# Patient Record
Sex: Male | Born: 1950 | Race: Black or African American | Hispanic: No | Marital: Married | Smoking: Former smoker
Health system: Southern US, Community
[De-identification: ages and names within clinical notes are randomized; demographics above are authoritative.]

## PROBLEM LIST (undated history)

## (undated) DIAGNOSIS — I10 Essential (primary) hypertension: Secondary | ICD-10-CM

## (undated) DIAGNOSIS — C801 Malignant (primary) neoplasm, unspecified: Secondary | ICD-10-CM

## (undated) HISTORY — DX: Essential (primary) hypertension: I10

## (undated) HISTORY — DX: Malignant (primary) neoplasm, unspecified: C80.1

---

## 2001-04-14 HISTORY — PX: FINGER SURGERY: SHX640

## 2003-01-02 ENCOUNTER — Encounter: Admission: RE | Admit: 2003-01-02 | Discharge: 2003-01-02 | Payer: Self-pay | Admitting: Family Medicine

## 2003-01-02 ENCOUNTER — Encounter: Payer: Self-pay | Admitting: Family Medicine

## 2003-03-14 ENCOUNTER — Encounter: Admission: RE | Admit: 2003-03-14 | Discharge: 2003-03-14 | Payer: Self-pay | Admitting: Family Medicine

## 2003-03-15 ENCOUNTER — Ambulatory Visit (HOSPITAL_COMMUNITY): Admission: RE | Admit: 2003-03-15 | Discharge: 2003-03-15 | Payer: Self-pay | Admitting: Orthopedic Surgery

## 2003-03-15 ENCOUNTER — Ambulatory Visit (HOSPITAL_BASED_OUTPATIENT_CLINIC_OR_DEPARTMENT_OTHER): Admission: RE | Admit: 2003-03-15 | Discharge: 2003-03-15 | Payer: Self-pay | Admitting: Orthopedic Surgery

## 2007-11-22 ENCOUNTER — Emergency Department (HOSPITAL_COMMUNITY): Admission: EM | Admit: 2007-11-22 | Discharge: 2007-11-22 | Payer: Self-pay | Admitting: Emergency Medicine

## 2010-08-30 NOTE — Op Note (Signed)
NAME:  Richard Henson, STROSCHEIN                          ACCOUNT NO.:  [id number]   MEDICAL RECORD NO.:  [id number]                   PATIENT TYPE:  AMB   LOCATION:  DSC                                  FACILITY:  [facility]   PHYSICIAN:  Cindee Salt, M.D.                    DATE OF BIRTH:  08-30-1950   DATE OF PROCEDURE:  03/15/2003  DATE OF DISCHARGE:                                 OPERATIVE REPORT   PREOPERATIVE DIAGNOSIS:  Infection of right index finger, questionable  osteomyelitis, distal phalanx.   POSTOPERATIVE DIAGNOSIS:  Infection of right index finger, questionable  osteomyelitis, distal phalanx.   OPERATION:  Incision and drainage and debridement of distal phalanx, right  index finger.   SURGEON:  Cindee Salt, M.D.   ASSISTANT:  Alfredo Bach.   ANESTHESIA:  General.   HISTORY:  The patient is a 60 year old male with a history of pain, swelling  and drainage of his right index finger.  This has been going on for  approximately two weeks.  He has been on Levaquin.   PROCEDURE:  The patient was brought to the operating room where a general  anesthetic was carried out without complication.  He was prepped using  Duraprep in supine position, right arm free.  The area was debrided and an  incision made.  The area of loss of skin was through the lateral nail fold.  The incision was immediately encountered with a granulation tissue.  Cultures were taken for both aerobic and anaerobic cultures.  This was  followed down to the condyle of the distal phalanx; this was curetted.  No  gross purulence was appreciated.  The wound was copiously irrigated with  saline and packed with Iodoform gauze.  Sterile compressive dressing and  splint were applied.  The patient tolerated the procedure well and was taken  to the recovery room for observation in satisfactory condition.   He is discharged home to return to the [facility] on Monday,  on Levaquin and Vicodin.                                Cindee Salt, M.D.    GK/MEDQ  D:  03/15/2003  T:  03/15/2003  Job:  [id number]

## 2012-03-17 ENCOUNTER — Other Ambulatory Visit: Payer: Self-pay | Admitting: Family Medicine

## 2012-03-17 ENCOUNTER — Ambulatory Visit
Admission: RE | Admit: 2012-03-17 | Discharge: 2012-03-17 | Disposition: A | Discharge status: Home / Self Care | Payer: 59 | Source: Ambulatory Visit | Attending: Family Medicine | Admitting: Family Medicine

## 2012-03-17 DIAGNOSIS — M549 Dorsalgia, unspecified: Secondary | ICD-10-CM

## 2012-03-17 IMAGING — CR DG LUMBAR SPINE COMPLETE 4+V
5 series · 5 of 5 positions shown · non-contrast
Comparison: None.

CLINICAL DATA: Back pain with left leg numbness.  No known injury.

LUMBAR SPINE - COMPLETE 4+ VIEW

[t l-spine a.p.]
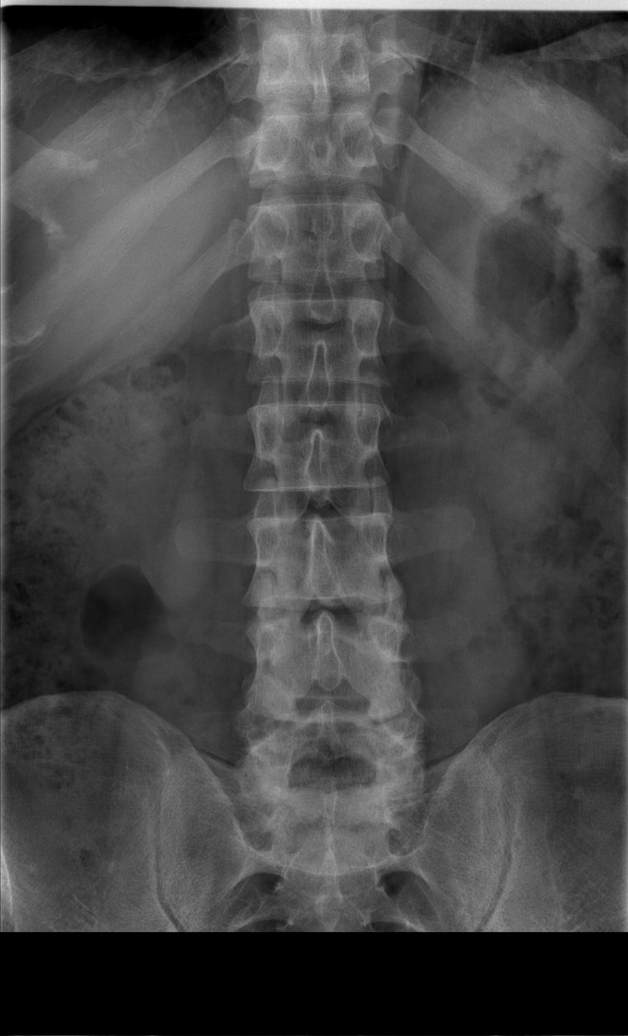

[t l-spine oblique exposure (1 of 2)]
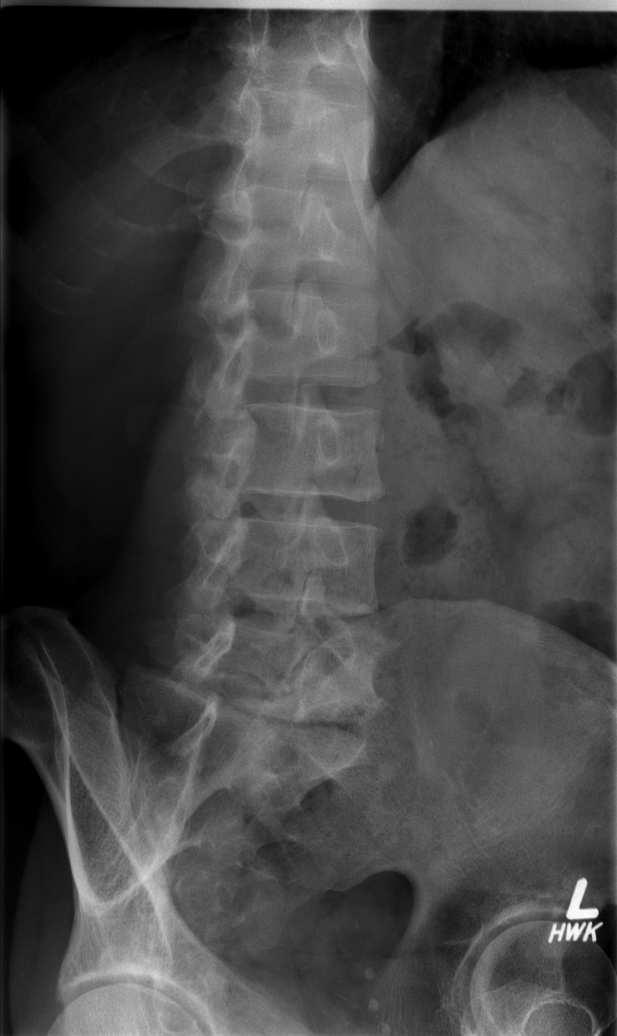

[t l-spine oblique exposure (2 of 2)]
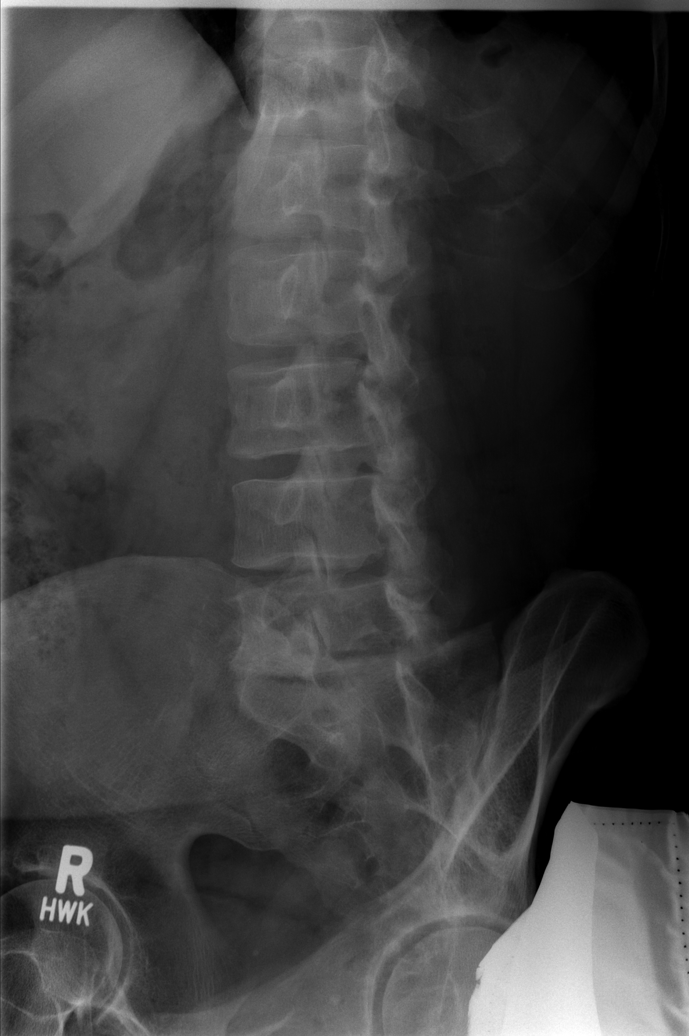

[t l-spine lat]
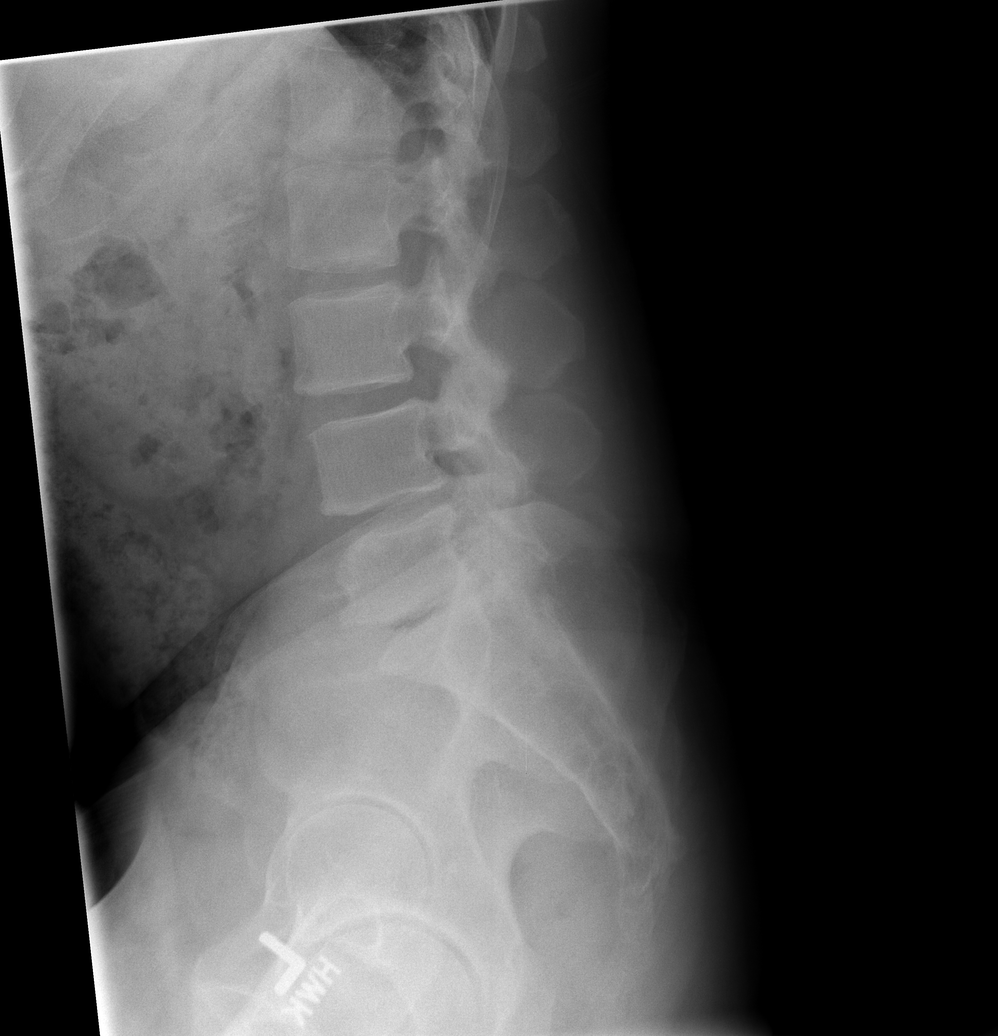

[t l-spine l5-s1 spot]
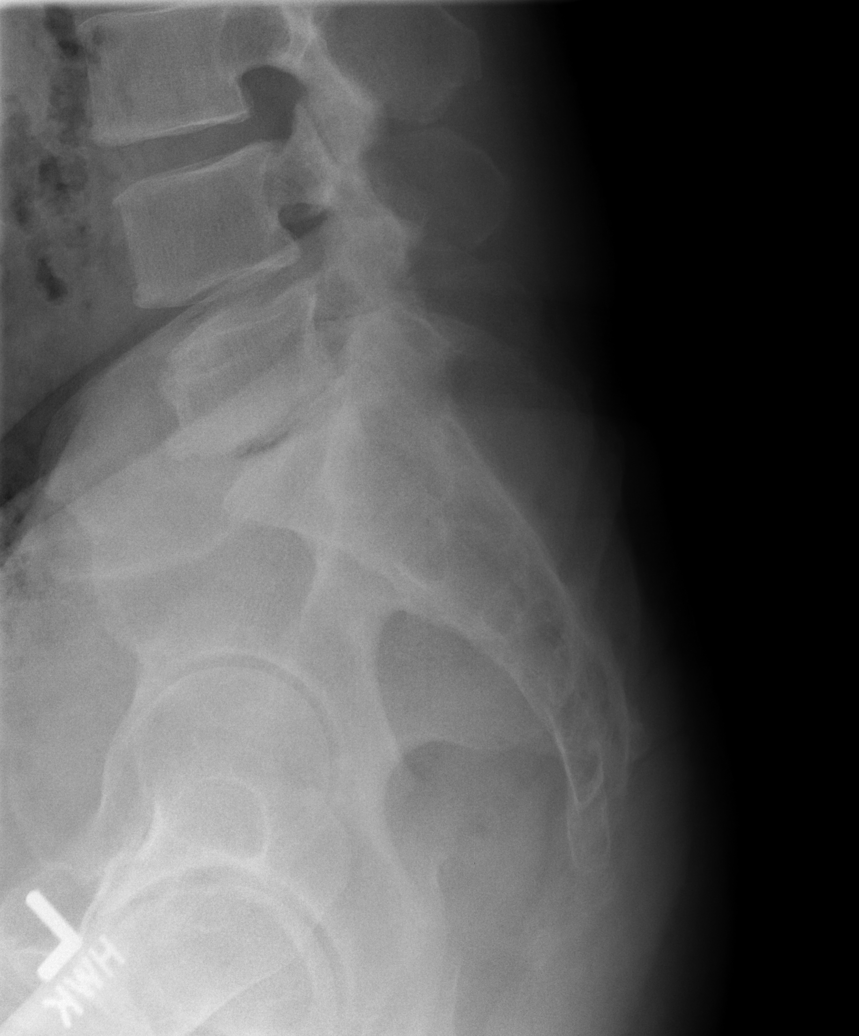

[5 of 5 positions shown; findings below may reference images not displayed]

FINDINGS: There are five lumbar type vertebral bodies.  The
alignment is normal.  There is mild disc space loss at L4-L5 and
moderate disc space loss at L5-S1.  Facet degenerative changes are
present inferiorly.  There is no evidence of acute fracture or pars
defect.
IMPRESSION: Lower lumbar spondylosis as described, most advanced at L5-S1.  No
acute osseous findings or malalignment.

## 2013-04-26 ENCOUNTER — Telehealth: Payer: Self-pay | Admitting: Radiology

## 2013-04-26 NOTE — Telephone Encounter (Signed)
Called for records/ this is I/ A opened epic in error.

## 2013-11-03 ENCOUNTER — Other Ambulatory Visit: Payer: Self-pay | Admitting: Internal Medicine

## 2013-11-03 ENCOUNTER — Encounter (INDEPENDENT_AMBULATORY_CARE_PROVIDER_SITE_OTHER): Payer: Self-pay

## 2013-11-03 ENCOUNTER — Ambulatory Visit
Admission: RE | Admit: 2013-11-03 | Discharge: 2013-11-03 | Disposition: A | Discharge status: Home / Self Care | Payer: 59 | Source: Ambulatory Visit | Attending: Internal Medicine | Admitting: Internal Medicine

## 2013-11-03 DIAGNOSIS — M79604 Pain in right leg: Secondary | ICD-10-CM

## 2013-11-03 IMAGING — CR DG TIBIA/FIBULA 2V*R*
4 series · 4 of 4 positions shown · non-contrast
Comparison: None.

CLINICAL DATA: Pain in the mid to distal RIGHT tibia and fibula. No
injury.

EXAM:
RIGHT TIBIA AND FIBULA - 2 VIEW

[view not recorded (1 of 4)]
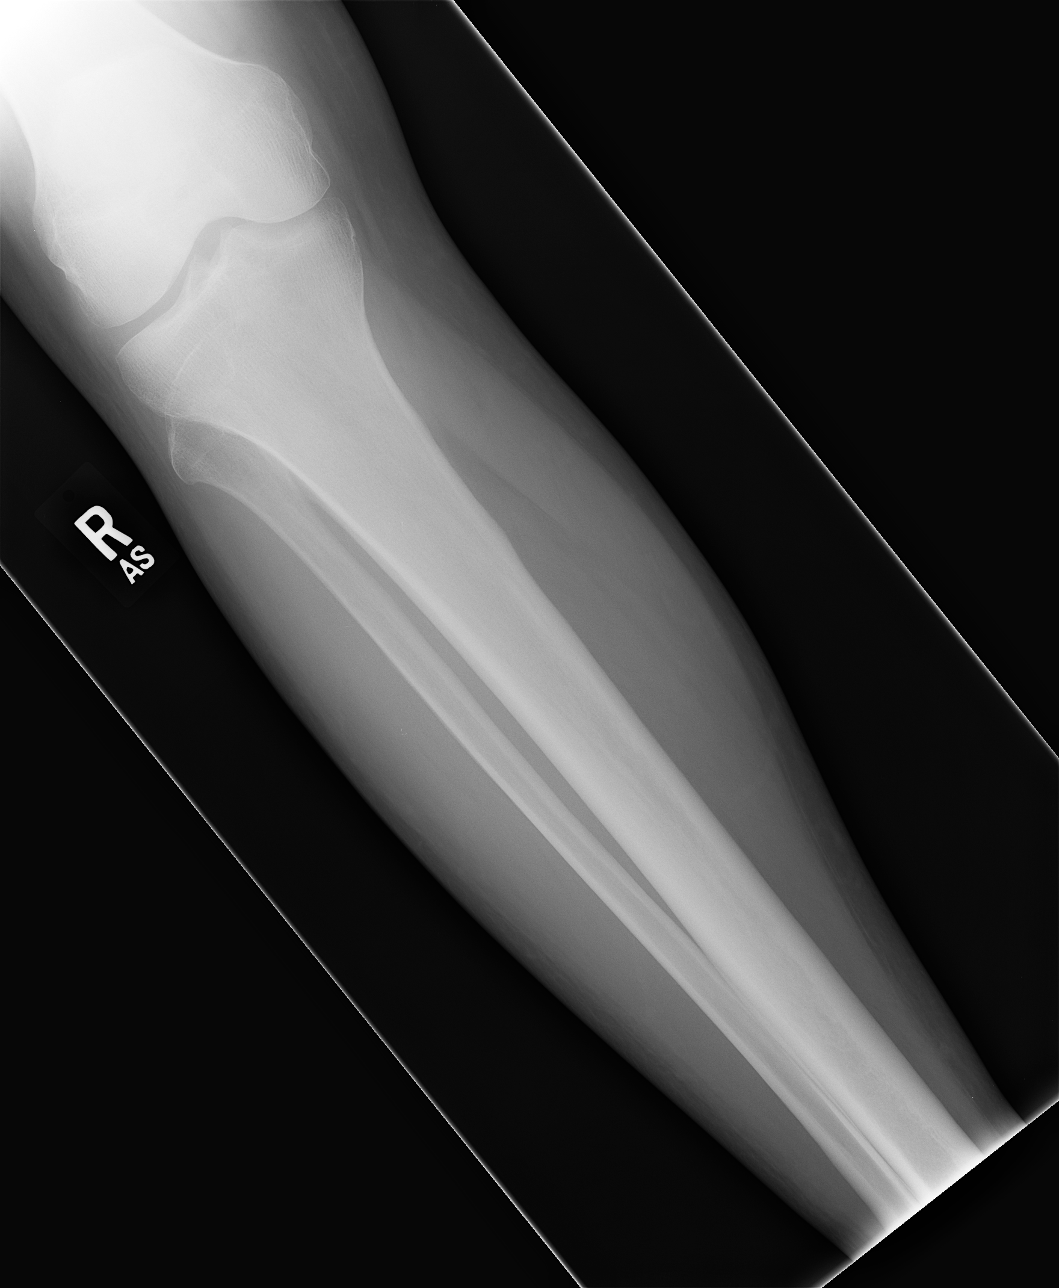

[view not recorded (2 of 4)]
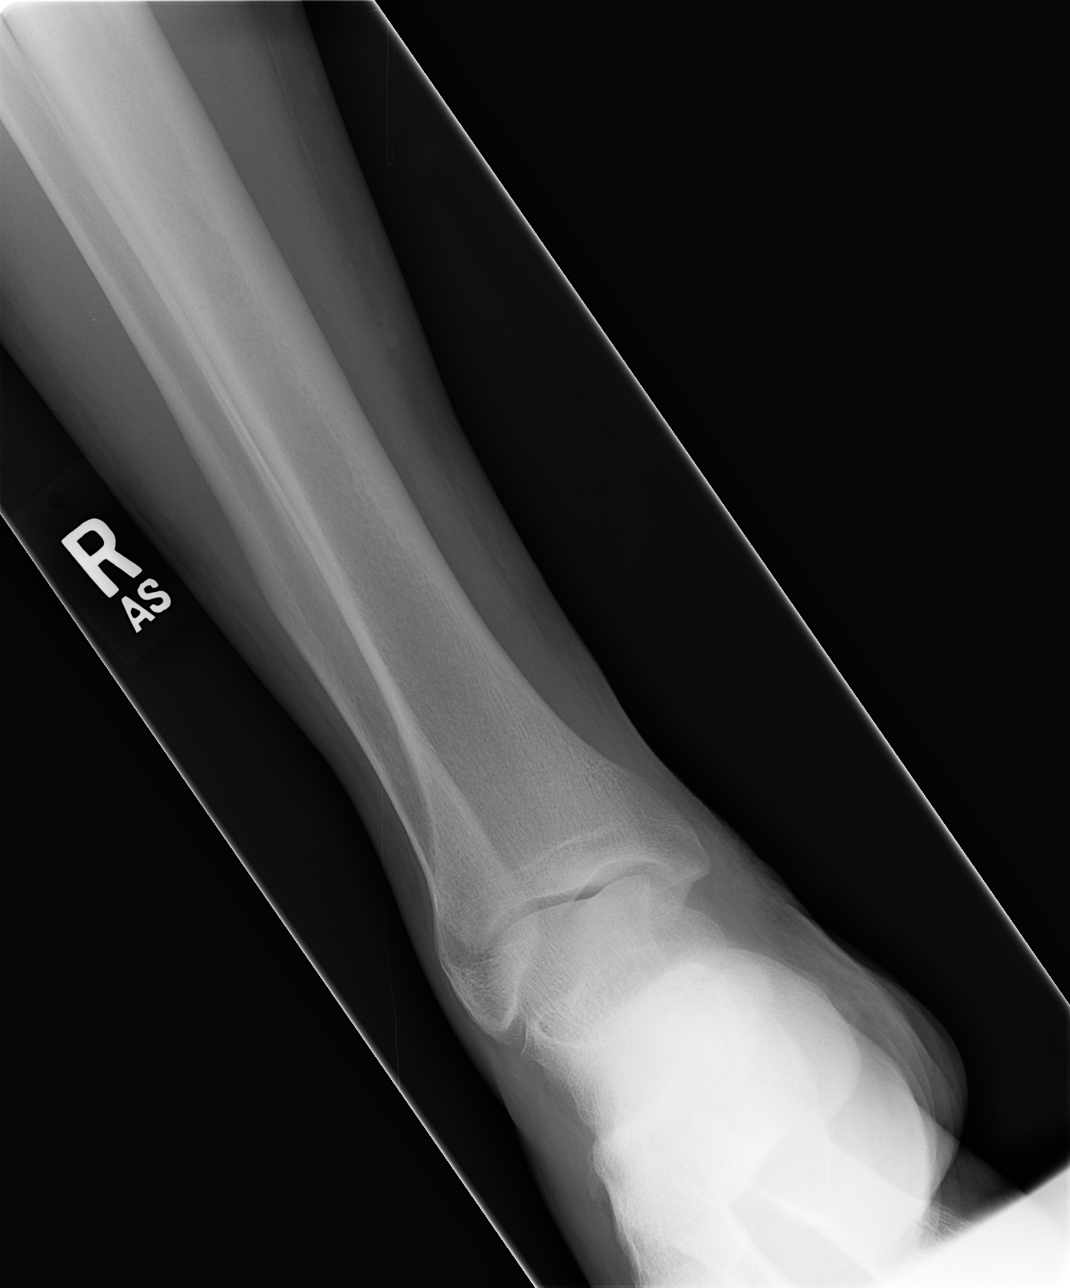

[view not recorded (3 of 4)]
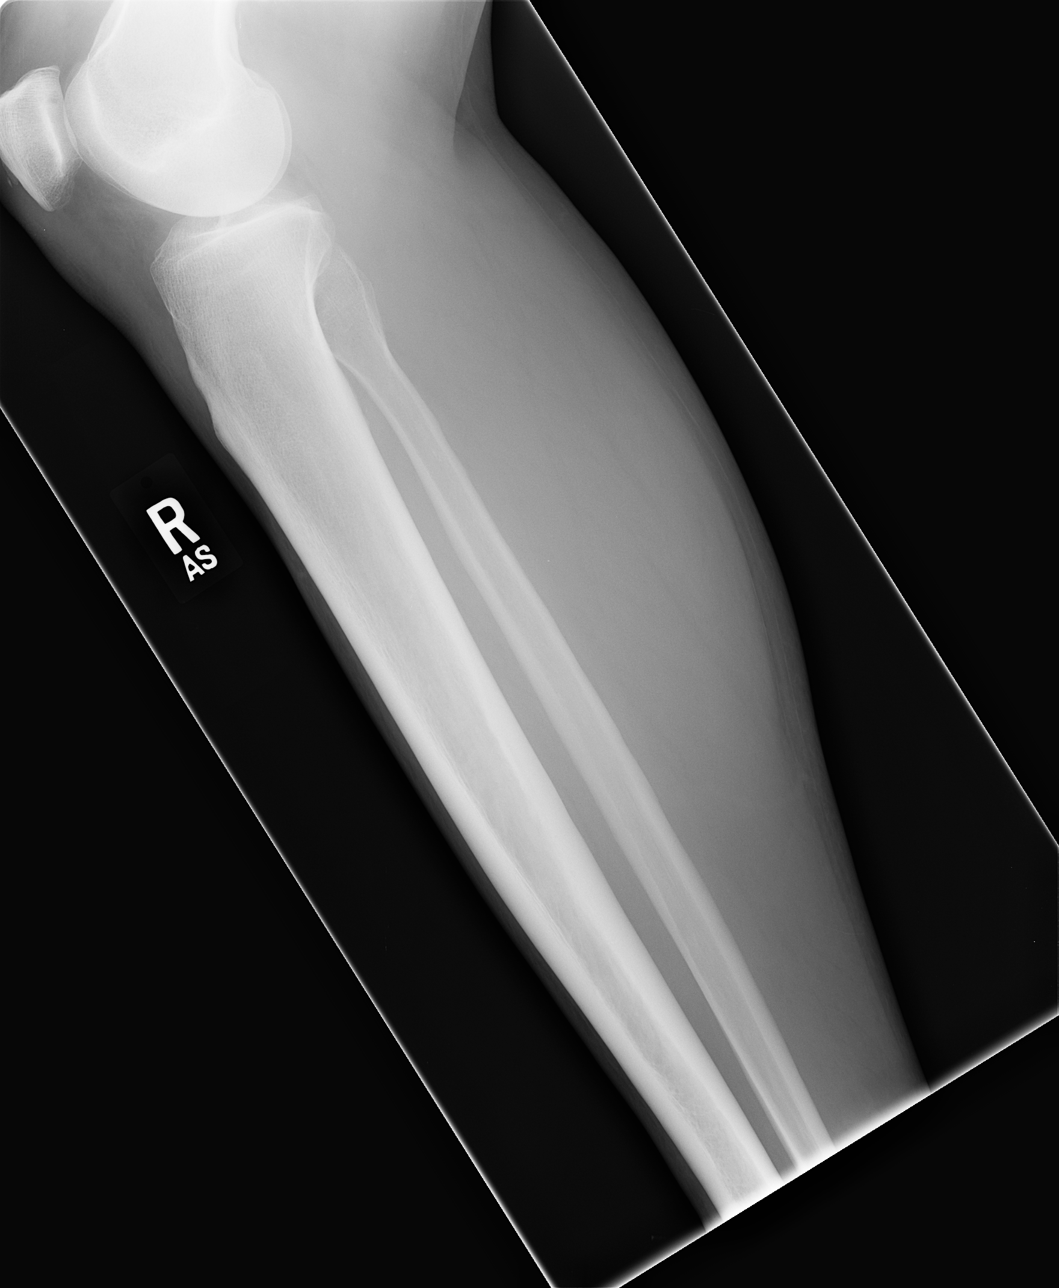

[view not recorded (4 of 4)]
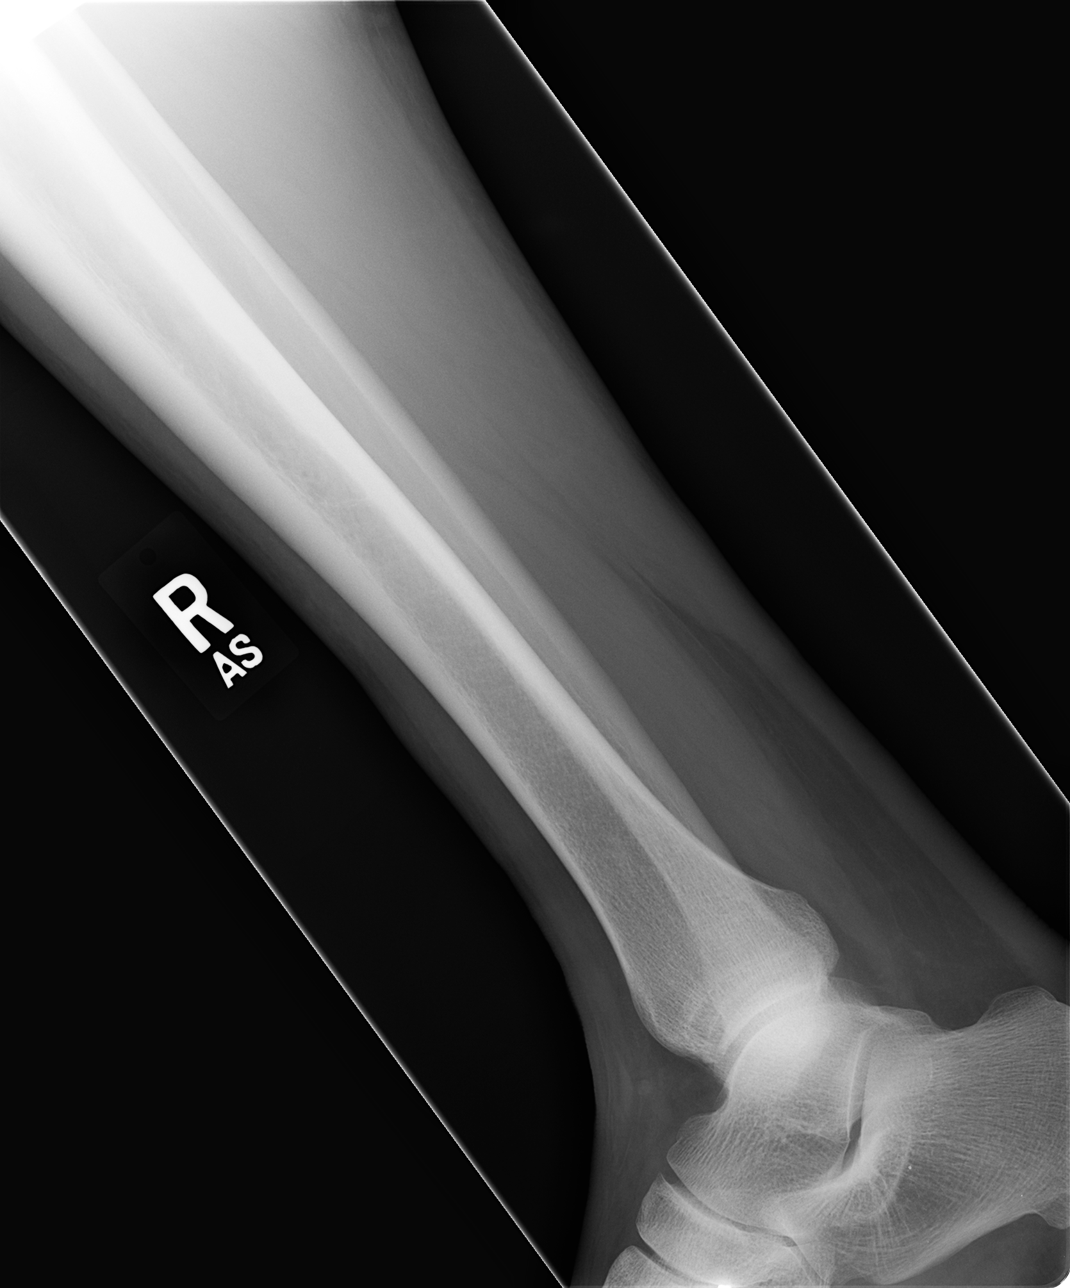

[4 of 4 positions shown; findings below may reference images not displayed]

FINDINGS: There is no evidence of fracture or other focal bone lesions. Soft
tissues are unremarkable.
IMPRESSION: Negative.

## 2013-11-12 HISTORY — PX: DENTAL SURGERY: SHX609

## 2013-11-16 ENCOUNTER — Ambulatory Visit (INDEPENDENT_AMBULATORY_CARE_PROVIDER_SITE_OTHER)
Admission: RE | Admit: 2013-11-16 | Discharge: 2013-11-16 | Disposition: A | Discharge status: Home / Self Care | Payer: 59 | Source: Ambulatory Visit | Attending: Internal Medicine | Admitting: Internal Medicine

## 2013-11-16 ENCOUNTER — Encounter: Payer: Self-pay | Admitting: Internal Medicine

## 2013-11-16 ENCOUNTER — Ambulatory Visit (INDEPENDENT_AMBULATORY_CARE_PROVIDER_SITE_OTHER): Payer: 59 | Admitting: Internal Medicine

## 2013-11-16 VITALS — BP 136/74 | HR 73 | Temp 98.8°F | Ht 77.0 in | Wt 236.0 lb

## 2013-11-16 DIAGNOSIS — R05 Cough: Secondary | ICD-10-CM

## 2013-11-16 DIAGNOSIS — R059 Cough, unspecified: Secondary | ICD-10-CM

## 2013-11-16 DIAGNOSIS — R058 Other specified cough: Secondary | ICD-10-CM | POA: Insufficient documentation

## 2013-11-16 IMAGING — CR DG CHEST 2V
2 series · 2 of 2 positions shown · non-contrast
Comparison: None.

CLINICAL DATA: Productive cough.  Short of breath.

EXAM:
CHEST  2 VIEW

[view not recorded (1 of 2)]
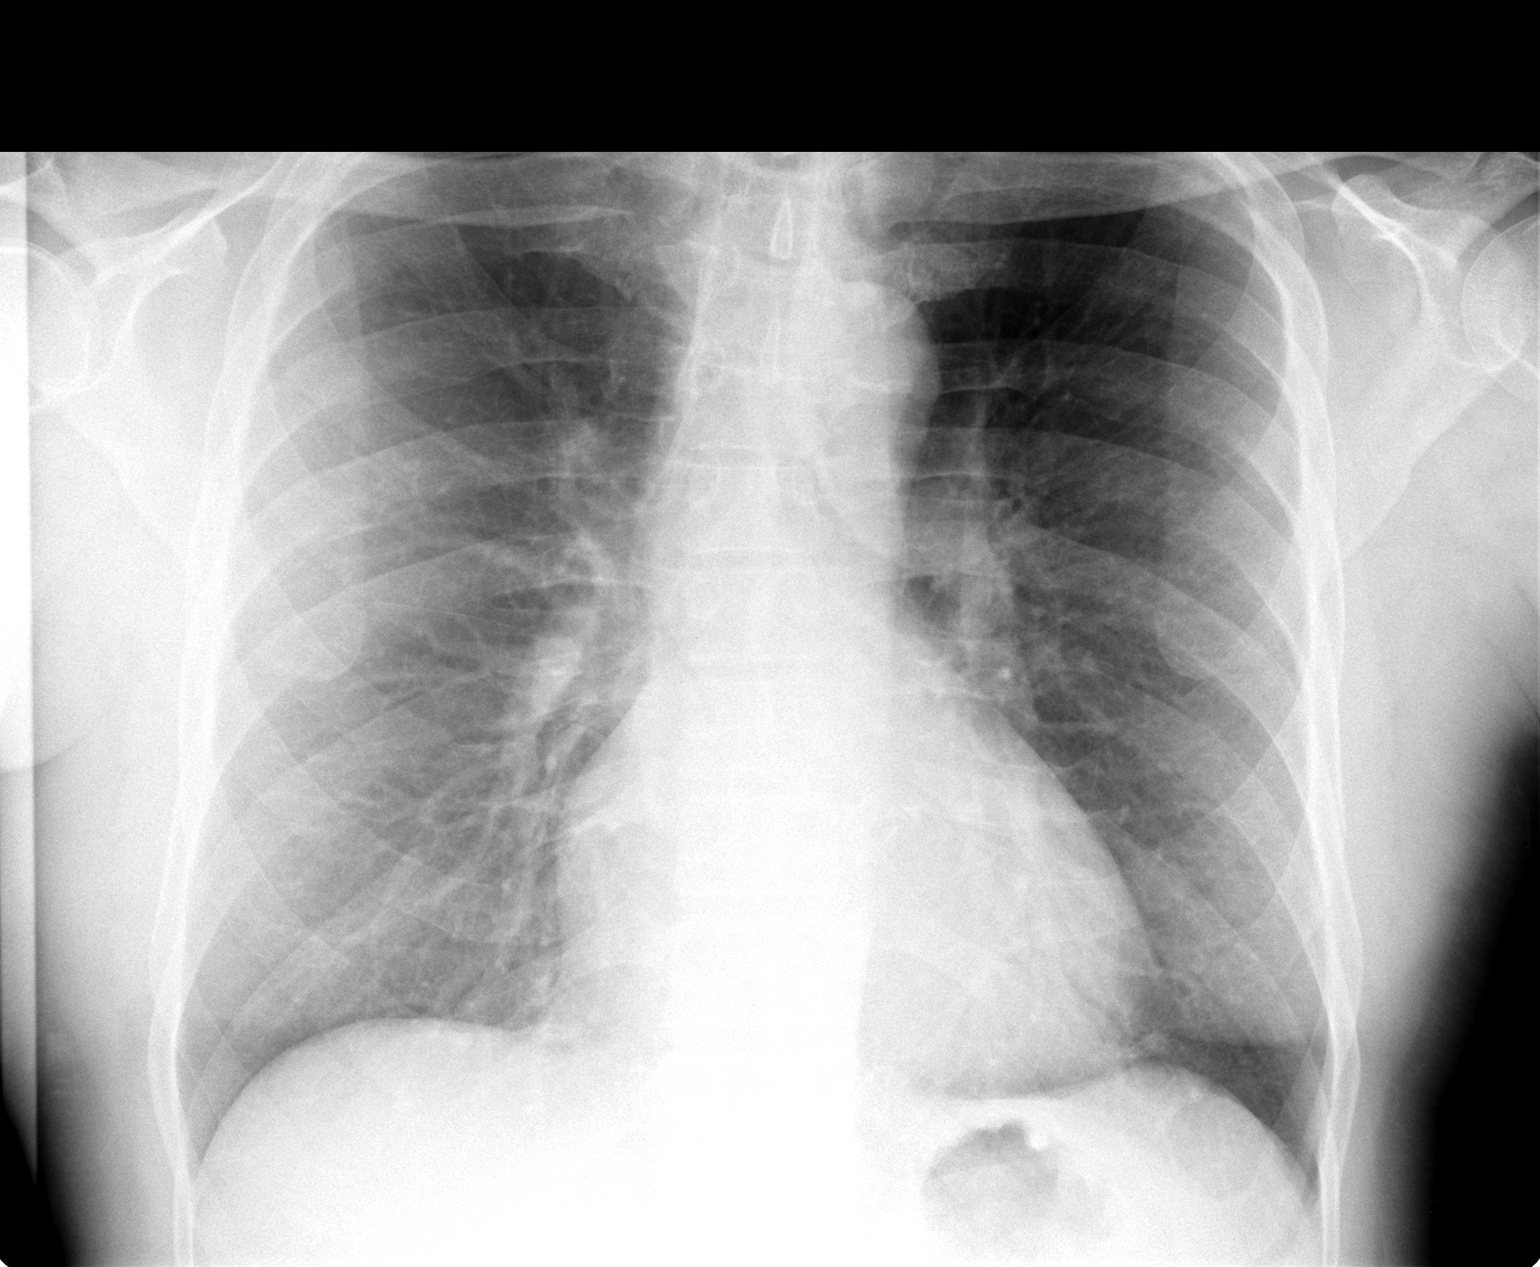

[view not recorded (2 of 2)]
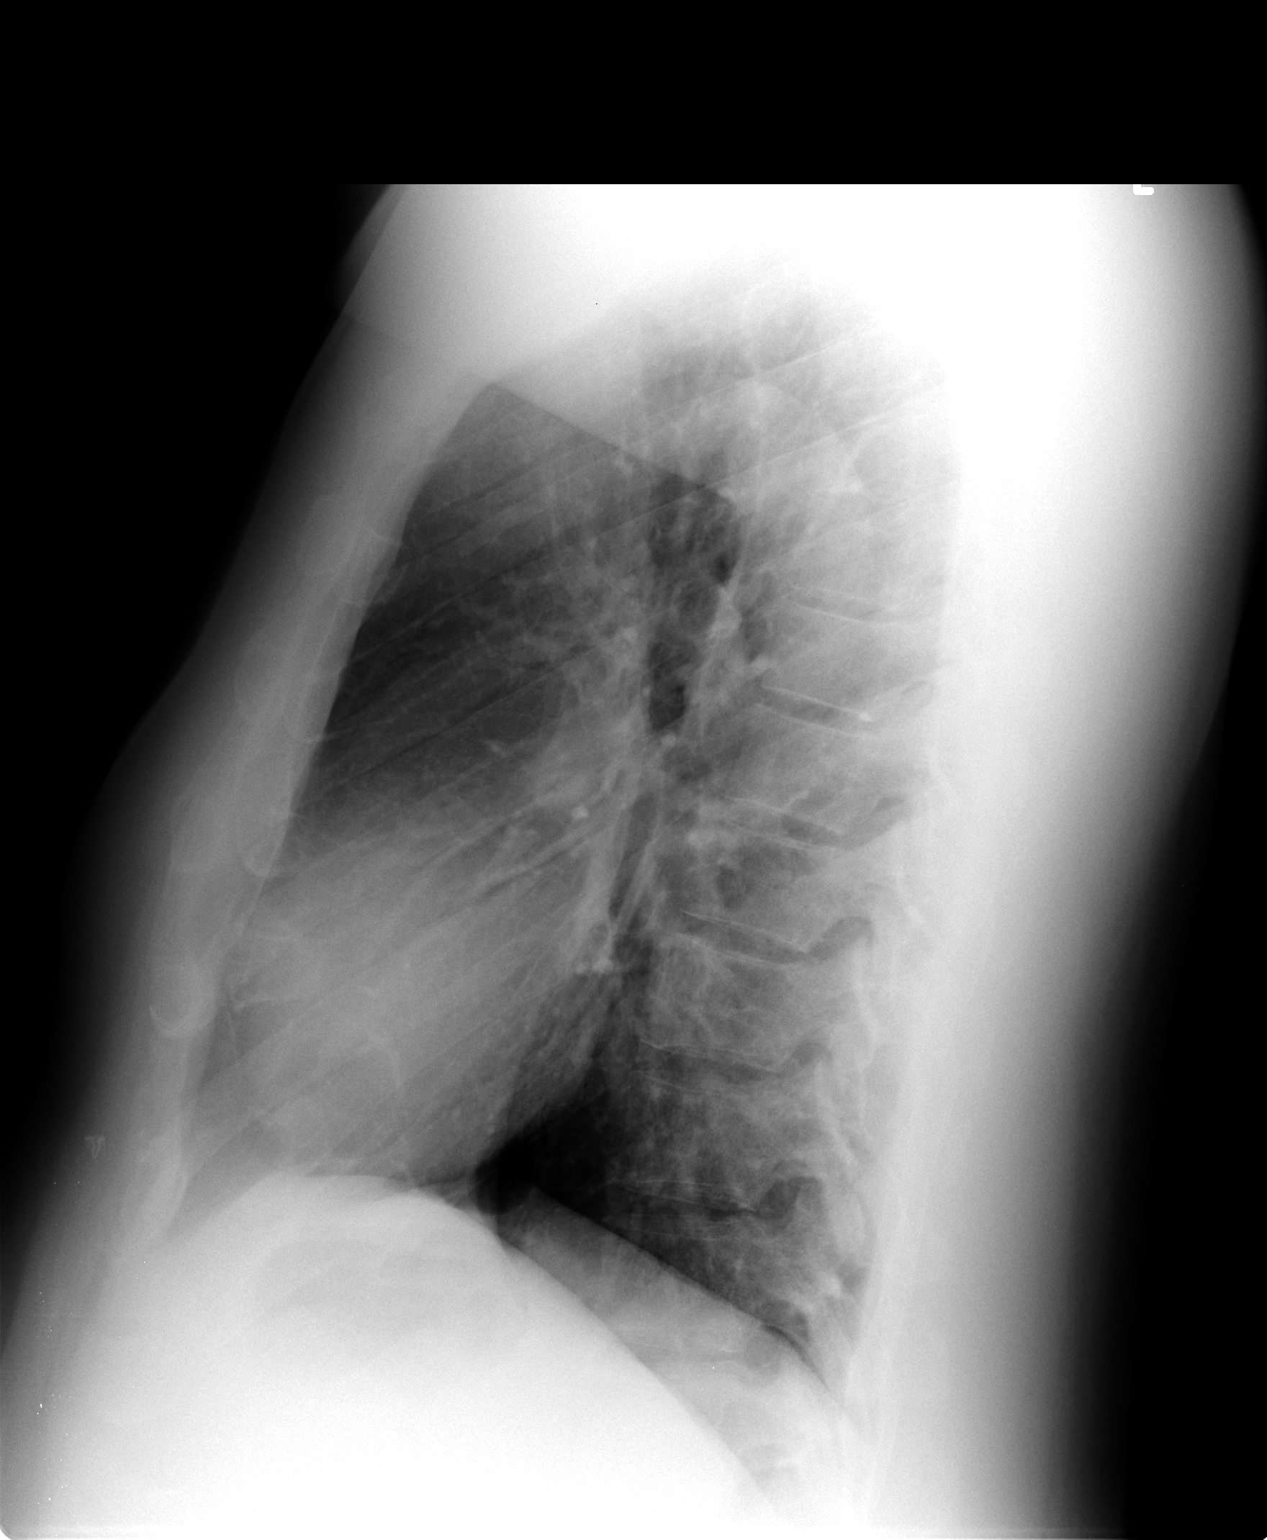

[2 of 2 positions shown; findings below may reference images not displayed]

FINDINGS: The heart size and mediastinal contours are within normal limits.
Both lungs are clear. The visualized skeletal structures are
unremarkable.
IMPRESSION: No active cardiopulmonary disease.

## 2013-11-16 NOTE — Progress Notes (Signed)
   Subjective:    Patient ID: Richard Henson, male    DOB: September 29, 1950  MRN: [id number]  HPI  31 yobm smoked as teenager/ works as Administrator to [place] still some jogging but noted onset of sob at hs but not with exertion so self referred to pulm clinic 11/16/2013   11/16/2013 1st [place] [facility] office visit/ Richard Henson  Chief Complaint  Patient presents with  . Advice Only    self referral for SOB.  C/o increased SOB, chest heaviness at rest X1.5 months.    sob first started about 3 m prior to OV  In setting of sensation of pnds x decades maybe worse in winter assoc with mostly daytime dry cough uses lots of peppermints but no meds so far.  No obvious other patterns in day to day or daytime variabilty or assoc  cp or chest tightness, subjective wheeze overt sinus or hb symptoms. No unusual exp hx or h/o childhood pna/ asthma or knowledge of premature birth.  Sleeping ok without nocturnal  or early am exacerbation  of respiratory  c/o's or need for noct saba. Also denies any obvious fluctuation of symptoms with weather or environmental changes or other aggravating or alleviating factors except as outlined above   Current Medications, Allergies, Complete Past Medical History, Past Surgical History, Family History, and Social History were reviewed in [employer] record.            Review of Systems  Constitutional: Negative for fever and unexpected weight change.  HENT: Positive for congestion, postnasal drip and sinus pressure. Negative for dental problem, ear pain, nosebleeds, rhinorrhea, sneezing, sore throat and trouble swallowing.   Eyes: Negative for redness and itching.  Respiratory: Positive for cough and shortness of breath. Negative for chest tightness and wheezing.   Cardiovascular: Negative for palpitations and leg swelling.  Gastrointestinal: Negative for nausea and vomiting.  Genitourinary: Negative for dysuria.  Musculoskeletal: Negative for joint  swelling.  Skin: Negative for rash.  Neurological: Negative for headaches.  Hematological: Does not bruise/bleed easily.  Psychiatric/Behavioral: Negative for dysphoric mood. The patient is not nervous/anxious.        Objective:   Physical Exam  amb slt hoarse bm nad  Wt Readings from Last 3 Encounters:  11/16/13 236 lb (107.049 kg)     HEENT: nl dentition, turbinates, and orophanx. Nl external ear canals without cough reflex   NECK :  without JVD/Nodes/TM/ nl carotid upstrokes bilaterally   LUNGS: no acc muscle use, clear to A and P bilaterally without cough on insp or exp maneuvers   CV:  RRR  no s3 or murmur or increase in P2, no edema   ABD:  soft and nontender with nl excursion in the supine position. No bruits or organomegaly, bowel sounds nl  MS:  warm without deformities, calf tenderness, cyanosis or clubbing  SKIN: warm and dry without lesions    NEURO:  alert, approp, no deficits     CXR  11/16/2013 :  The heart size and mediastinal contours are within normal limits.  Both lungs are clear. The visualized skeletal structures are  unremarkable.        Assessment & Plan:

## 2013-11-16 NOTE — Patient Instructions (Addendum)
clariton or allegra can be tried for the drainage as needed and neither one cause drowsiness  Pepcid ac 20 mg one at bedtime (over the counter)  GERD (REFLUX)  is an extremely common cause of respiratory symptoms, many times with no significant heartburn at all.    It can be treated with medication, but also with lifestyle changes including avoidance of late meals, excessive alcohol, smoking cessation, and avoid fatty foods, chocolate, peppermint, colas, red wine, and acidic juices such as orange juice.  NO MINT OR MENTHOL PRODUCTS SO NO COUGH DROPS  USE SUGARLESS CANDY INSTEAD (jolley ranchers or Stover's)  - NO PEPPERMINTS  NO OIL BASED VITAMINS - use powdered substitutes.   Please remember to go to the  x-ray department downstairs for your tests - we will call you with the results when they are available.  If not satisfied after a month please return.

## 2013-11-16 NOTE — Assessment & Plan Note (Addendum)
The most common causes of chronic cough in immunocompetent adults include the following: upper airway cough syndrome (UACS), previously referred to as postnasal drip syndrome (PNDS), which is caused by variety of rhinosinus conditions; (2) asthma; (3) GERD; (4) chronic bronchitis from cigarette smoking or other inhaled environmental irritants; (5) nonasthmatic eosinophilic bronchitis; and (6) bronchiectasis.   These conditions, singly or in combination, have accounted for up to 94% of the causes of chronic cough in prospective studies.   Other conditions have constituted no >6% of the causes in prospective studies These have included bronchogenic carcinoma, chronic interstitial pneumonia, sarcoidosis, left ventricular failure, ACEI-induced cough, and aspiration from a condition associated with pharyngeal dysfunction.    Chronic cough is often simultaneously caused by more than one condition. A single cause has been found from 38 to 82% of the time, multiple causes from 18 to 62%. Multiply caused cough has been the result of three diseases up to 42% of the time.       Based on hx and exam, this is most likely:  Classic Upper airway cough syndrome, so named because it's frequently impossible to sort out how much is  CR/sinusitis with freq throat clearing (which can be related to primary GERD)   vs  causing  secondary (" extra esophageal")  GERD from wide swings in gastric pressure that occur with throat clearing, often  promoting self use of mint and menthol lozenges that reduce the lower esophageal sphincter tone and exacerbate the problem further in a cyclical fashion.   These are the same pts (now being labeled as having "irritable larynx syndrome" by some cough centers) who not infrequently have a history of having failed to tolerate ace inhibitors,  dry powder inhalers or biphosphonates or report having atypical reflux symptoms that don't respond to standard doses of PPI , and are easily confused as  having aecopd or asthma flares by even experienced allergists/ pulmonologists.   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

## 2013-11-16 NOTE — Progress Notes (Signed)
Quick Note:  ATC, NA and no option to leave a msg, WCB ______ 

## 2013-11-18 NOTE — Progress Notes (Signed)
Quick Note:  Spoke with pt and notified of results per Dr. Wert. Pt verbalized understanding and denied any questions.  ______ 

## 2013-12-16 ENCOUNTER — Ambulatory Visit (INDEPENDENT_AMBULATORY_CARE_PROVIDER_SITE_OTHER): Payer: 59 | Admitting: Family Medicine

## 2013-12-16 VITALS — BP 128/82 | HR 62 | Temp 98.2°F | Resp 17 | Ht 77.0 in | Wt 238.0 lb

## 2013-12-16 DIAGNOSIS — I498 Other specified cardiac arrhythmias: Secondary | ICD-10-CM

## 2013-12-16 DIAGNOSIS — R001 Bradycardia, unspecified: Secondary | ICD-10-CM

## 2013-12-16 DIAGNOSIS — R0602 Shortness of breath: Secondary | ICD-10-CM

## 2013-12-16 NOTE — Patient Instructions (Signed)
Your EKG is essentially normal, but slow.  Your heart rate was normal last month at Dr. Gustavus Bryant office. For that reason, I recommend that you have a cardiology evaluation. In the meantime, please take the Allegra and Pepcid daily, recommended by Dr. Melvyn Novas. Also, if you have any chest pain, dizziness, worsening shortness of breath, you need prompt evaluation here or in the emergency department.

## 2013-12-16 NOTE — Progress Notes (Signed)
Subjective:    Patient ID: Richard Henson, male    DOB: 04-Dec-1950, 63 y.o.   MRN: [id number]   PCP: Kandice Hams, MD  Chief Complaint  Patient presents with  . Shortness of Breath      Active Ambulatory Problems    Diagnosis Date Noted  . Upper airway cough syndrome 11/16/2013   Resolved Ambulatory Problems    Diagnosis Date Noted  . No Resolved Ambulatory Problems   Past Medical History  Diagnosis Date  . Hypertension   . Cancer     Past Surgical History  Procedure Laterality Date  . Finger surgery  2003    infection in [place]  . Dental surgery  11/2013    Allergies  Allergen Reactions  . Penicillins     Lip swelling, rash    Prior to Admission medications   Medication Sig Start Date End Date Taking? Authorizing Provider  alfuzosin (UROXATRAL) 10 MG 24 hr tablet Take 10 mg by mouth daily as needed.   Yes Historical Provider, MD  amLODipine (NORVASC) 10 MG tablet Take 10 mg by mouth daily.   Yes Historical Provider, MD  losartan-hydrochlorothiazide (HYZAAR) 100-25 MG per tablet Take 1 tablet by mouth daily.   Yes Historical Provider, MD    History   Social History  . Marital Status: Married    Spouse Name: Helene Kelp    Number of Children: 3  . Years of Education: 14   Occupational History  . Truck Training and development officer; usually local and long-distance   Social History Main Topics  . Smoking status: Former Smoker -- 0.10 packs/day for 2 years    Types: Cigarettes    Quit date: 11/16/1968  . Smokeless tobacco: Never Used     Comment: tried smoking as a teenager, never developed an addiction to it.   . Alcohol Use: No  . Drug Use: No  . Sexual Activity: Yes    Partners: Female   Other Topics Concern  . None   Social History Narrative   Lives with his wife and one son. Other sons live independently.    family history includes Alcohol abuse in his father; Breast cancer in his sister; Cancer in his brother, brother, and sister;  Hypertension in his brother, brother, and mother; Kidney cancer in his brother; Prostate cancer in his brother. indicated that his mother is deceased. He indicated that his father is deceased. He indicated that his sister is deceased. He indicated that both of his brothers are alive. He indicated that all of his three sons are alive.   HPI  This 63 y.o. male presents for evaluation of shortness of breath.  Symptoms began in July. "Sometimes I have little flutters."   No chest pain. Used to keep him from sleeping. "I cough up sometimes mucous." He says that his wife says, "You blow your nose all year 'round." "I have drainage."  Remote, brief, minimal tobacco history. Remote, brief work in a [employer], exposed to a lot of pine dust.  He saw his PCP in 10/2013. His PSA was elevated, and he was referred to Urology. Dr. Risa Grill did a biopsy, and he was found to have prostate cancer, but a slow growing one, and they are proceeding with conservative therapy.  His shortness of breath began around that time.  He saw Dr. Melvyn Novas 4 weeks ago for same. CXR performed revealed normal heart size and contours, clear lungs bilaterally, unremarkable skeletal structures. He did not try the  recommended treatment with Pepcid or Allegra. He bought something from [employer], which has helped his symptoms previously, but he hasn't tried it again.  He went to the gym on 8/31 and ran on the treadmill, even doing "wind sprints," and the sensation resolved.  Last night it came back.   Review of Systems     Objective:   Physical Exam  Constitutional: He is oriented to person, place, and time. He appears well-developed and well-nourished. No distress.  BP 128/82  Pulse 62  Temp(Src) 98.2 F (36.8 C) (Oral)  Resp 17  Ht 6\' 5"  (1.956 m)  Wt 238 lb (107.956 kg)  BMI 28.22 kg/m2  SpO2 100%   Eyes: No scleral icterus.  Neck: Neck supple. No thyromegaly present.  Cardiovascular: Normal rate, regular rhythm, normal  heart sounds and intact distal pulses.   Pulmonary/Chest: Effort normal and breath sounds normal.  Lymphadenopathy:    He has no cervical adenopathy.  Neurological: He is alert and oriented to person, place, and time.  Skin: Skin is warm and dry.  Psychiatric: He has a normal mood and affect. His speech is normal and behavior is normal. Thought content normal.   EKG reviewed with Dr. Linna Darner. Sinus Bradycardia. Non-specific J-wave elevation.    Assessment & Plan:  1. SOB (shortness of breath) 2. Bradycardia Unclear etiology. Needs cardiology evaluation. To seek emergency care if he develops chest pain, dizziness, becomes unsteady. Take Allegra and Pepcid as per Dr. Gustavus Bryant recommendations. - EKG 12-Lead - Ambulatory referral to Cardiology   Fara Chute, PA-C Physician Assistant-Certified [facility]

## 2013-12-21 ENCOUNTER — Telehealth: Payer: Self-pay | Admitting: Physician Assistant

## 2013-12-21 NOTE — Telephone Encounter (Signed)
Patient states that he needs a note allowing him to return to work. Patient sees his cardiologist on 01/18/2014 however he wants to return to work before then.  [phone] (cell) [phone] (home)

## 2013-12-23 ENCOUNTER — Telehealth: Payer: Self-pay

## 2013-12-23 NOTE — Telephone Encounter (Signed)
Patient came by again to check status of letter. Please advise he's been waiting for this note for several days. Patient ask if Dr. Linna Darner can write note or chelle.   Message below from previous telephone message:  Patient states that he needs a note allowing him to return to work. Patient sees his cardiologist on 01/18/2014 however he wants to return to work before then.  [phone] (cell) [phone] (home)

## 2013-12-23 NOTE — Telephone Encounter (Signed)
Spoke to pt- advised letter was written returning him to work the day following his OV. Dr. Linna Darner did not take pt out of work. Pt advised this was ok and will pick up note later today.

## 2013-12-24 NOTE — Telephone Encounter (Signed)
It is my impression that the patient should be recheck before being given a note. It would still be best if he saw the cardiologist, and we should call and see if that can be moved up to an appointment this week. If he cannot get in with a cardiologist, he should come back in here.

## 2013-12-27 NOTE — Telephone Encounter (Signed)
Lm for rtn call 

## 2013-12-28 NOTE — Telephone Encounter (Signed)
LM for pt to rtn to his cardiologist or RTC if he is unable to get an appt this week with them. Rtn call with any further questions.

## 2014-01-13 ENCOUNTER — Encounter: Payer: Self-pay | Admitting: Interventional Cardiology

## 2014-01-13 ENCOUNTER — Other Ambulatory Visit: Payer: Self-pay | Admitting: Cardiology

## 2014-01-13 ENCOUNTER — Ambulatory Visit (HOSPITAL_COMMUNITY): Payer: 59 | Attending: Interventional Cardiology | Admitting: *Deleted

## 2014-01-13 ENCOUNTER — Ambulatory Visit (INDEPENDENT_AMBULATORY_CARE_PROVIDER_SITE_OTHER): Payer: 59 | Admitting: Interventional Cardiology

## 2014-01-13 VITALS — BP 150/88 | HR 60 | Ht 77.0 in | Wt 235.0 lb

## 2014-01-13 DIAGNOSIS — M79604 Pain in right leg: Secondary | ICD-10-CM | POA: Diagnosis present

## 2014-01-13 DIAGNOSIS — I10 Essential (primary) hypertension: Secondary | ICD-10-CM | POA: Insufficient documentation

## 2014-01-13 DIAGNOSIS — R600 Localized edema: Secondary | ICD-10-CM | POA: Insufficient documentation

## 2014-01-13 MED ORDER — LOSARTAN POTASSIUM 100 MG PO TABS: 100.0000 mg | ORAL_TABLET | Freq: Every day | ORAL | Status: AC

## 2014-01-13 MED ORDER — AMLODIPINE BESYLATE 10 MG PO TABS: 5.0000 mg | ORAL_TABLET | Freq: Every day | ORAL | Status: AC

## 2014-01-13 NOTE — Patient Instructions (Signed)
Your physician has recommended you make the following change in your medication:   1. Stop Losartan-hctz.  2. Restart Amlodipine 10 mg at 1/2 tablet daily.   3. Start Losartan 100 mg 1 tablet daily.   Your physician has requested that you have a lower or upper extremity venous duplex TODAY. This test is an ultrasound of the veins in the legs or arms. It looks at venous blood flow that carries blood from the heart to the legs or arms. Allow one hour for a Lower Venous exam. Allow thirty minutes for an Upper Venous exam. There are no restrictions or special instructions.  Your physician recommends that you schedule a follow-up appointment in: 2 MONTHS WITH DR. VARANASI.

## 2014-01-13 NOTE — Progress Notes (Signed)
Patient ID: Richard Henson, male   DOB: 06/13/1950, 63 y.o.   MRN: [id number]     Patient ID: Richard Henson MRN: [id number] DOB/AGE: 1950-12-19 63 y.o.   Referring Physician Dr. Delfina Redwood   Reason for Consultation [facility], HTN  HPI: 63 y/o who has had HTN and some SHOB.  He reports right lateral lower leg pain.  SHOB could come when he was lying in bed.  He was seen by plmonary.  It was thought that he may have GERD.  SHOB has improved with more exercise and changing his diet to include more fruits and vegetables. No CP or DOE.    He had an ETT and echo in [place] due to increased BP.  No ischemia noted.  Normal LV function.    He did not like having to urinate more with HCTZ.  He felt swelling with high dose amlodipine, but this controlled BP the best.    Current Outpatient Prescriptions  Medication Sig Dispense Refill  . alfuzosin (UROXATRAL) 10 MG 24 hr tablet Take 10 mg by mouth daily as needed.      Marland Kitchen CIALIS 20 MG tablet Take 20 mg by mouth daily as needed.       Marland Kitchen losartan-hydrochlorothiazide (HYZAAR) 100-12.5 MG per tablet Take 1 tablet by mouth daily.       No current facility-administered medications for this visit.   Past Medical History  Diagnosis Date  . Hypertension   . Cancer     prostate; conservative management 11/2013    Family History  Problem Relation Age of Onset  . Prostate cancer Brother   . Cancer Brother     prostate  . Hypertension Brother   . Kidney cancer Brother   . Hypertension Brother   . Cancer Brother     renal cell  . Breast cancer Sister   . Cancer Sister   . Hypertension Mother   . Alcohol abuse Father     History   Social History  . Marital Status: Married    Spouse Name: Helene Kelp    Number of Children: 3  . Years of Education: 14   Occupational History  . Truck Training and development officer; usually local and long-distance   Social History Main Topics  . Smoking status: Former Smoker -- 0.10 packs/day for 2 years   Types: Cigarettes    Quit date: 11/16/1968  . Smokeless tobacco: Never Used     Comment: tried smoking as a teenager, never developed an addiction to it.   . Alcohol Use: No  . Drug Use: No  . Sexual Activity: Yes    Partners: Female   Other Topics Concern  . Not on file   Social History Narrative   Lives with his wife and one son. Other sons live independently.    Past Surgical History  Procedure Laterality Date  . Finger surgery  2003    infection in [place]  . Dental surgery  11/2013      (Not in a hospital admission)  Review of systems complete and found to be negative unless listed above .  No nausea, vomiting.  No fever chills, No focal weakness,  No palpitations.  Physical Exam: Filed Vitals:   01/13/14 1150  BP: 150/88  Pulse: 60    Weight: 235 lb (106.595 kg)  Physical exam:  [place]/AT EOMI No JVD, No carotid bruit RRR S1S2  No wheezing Soft. NT, nondistended No edema. No focal motor or sensory deficits Normal affect  Labs:   No results found for this basename: WBC, HGB, HCT, MCV, PLT   No results found for this basename: NA, K, CL, CO2, BUN, CREATININE, CALCIUM, LABALBU, PROT, BILITOT, ALKPHOS, ALT, AST, GLUCOSE,  in the last 168 hours No results found for this basename: CKTOTAL, CKMB, CKMBINDEX, TROPONINI    No results found for this basename: CHOL   No results found for this basename: HDL   No results found for this basename: LDLCALC   No results found for this basename: TRIG   No results found for this basename: CHOLHDL   No results found for this basename: LDLDIRECT      Radiology: EKG: NSR, No ST segment changes  ASSESSMENT AND PLAN:  HTN:  Start losartan 100 mg daily.  Start amlodipine 5 mg daily (swelling will be better than at 10 mg).  This way he avoids the diuretic side effects.  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  If swelling does happen, can reduce amlodipine to 2.5 mg daily.    Records personally reviewed. Normal LV function on  echocardiogram. No ischemia on exercise treadmill test.  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   Edema: Likely related to amlodipine.   Improved off of medicine.    Signed:   Mina Marble, MD, [facility] 01/13/2014, 12:20 PM

## 2014-01-13 NOTE — Progress Notes (Signed)
Venous duplex lower right complete

## 2014-01-16 ENCOUNTER — Encounter (HOSPITAL_COMMUNITY): Payer: 59

## 2014-01-18 ENCOUNTER — Telehealth: Payer: Self-pay | Admitting: Interventional Cardiology

## 2014-01-18 NOTE — Telephone Encounter (Signed)
New message ° ° ° °Returning Amy's call °

## 2014-01-18 NOTE — Telephone Encounter (Signed)
Returned pts call with na - left message for pt of results of venous doppler and requested he call back if questions or concerns.

## 2014-03-14 ENCOUNTER — Encounter: Payer: Self-pay | Admitting: *Deleted

## 2014-03-15 ENCOUNTER — Ambulatory Visit: Payer: 59 | Admitting: Interventional Cardiology

## 2014-03-23 ENCOUNTER — Encounter: Payer: Self-pay | Admitting: Interventional Cardiology

## 2014-04-18 ENCOUNTER — Encounter: Payer: Self-pay | Admitting: Cardiology

## 2014-04-19 ENCOUNTER — Encounter: Payer: Self-pay | Admitting: Cardiology

## 2015-01-23 ENCOUNTER — Ambulatory Visit: Payer: 59 | Attending: Neurosurgery | Admitting: Physical Therapy

## 2015-01-23 ENCOUNTER — Encounter: Payer: Self-pay | Admitting: Physical Therapy

## 2015-01-23 DIAGNOSIS — M5441 Lumbago with sciatica, right side: Secondary | ICD-10-CM

## 2015-01-23 DIAGNOSIS — M629 Disorder of muscle, unspecified: Secondary | ICD-10-CM | POA: Diagnosis present

## 2015-01-24 NOTE — Therapy (Signed)
[address] [place], [place], [address] Phone: [phone]   Fax:  [phone]  Physical Therapy Evaluation  Patient Details  Name: Richard Henson MRN: [id number] Date of Birth: 07/02/1950 Referring Provider:  Jenne Campus, MD  Encounter Date: 01/23/2015      PT End of Session - 01/23/15 1134    Visit Number 1   Date for PT Re-Evaluation 03/25/15   PT Start Time 1104   Activity Tolerance Patient tolerated treatment well   Behavior During Therapy [facility] for tasks assessed/performed      Past Medical History  Diagnosis Date  . Hypertension   . Cancer [facility])     prostate; conservative management 11/2013    Past Surgical History  Procedure Laterality Date  . Finger surgery  2003    infection in [place]  . Dental surgery  11/2013    There were no vitals filed for this visit.  Visit Diagnosis:  Right-sided low back pain with right-sided sciatica - Plan: PT plan of care cert/re-cert  Hamstring tightness of both lower extremities - Plan: PT plan of care cert/re-cert      Subjective Assessment - 01/23/15 1104    Subjective Patient reports that he has DDD and that he was starting to have right calf pain with standing for about a year.  He reports he has had injections and has seen a chiropractor for this.   Limitations Standing   How long can you stand comfortably? 5 minutes   How long can you walk comfortably? 2 laps around track   Diagnostic tests DDD    Patient Stated Goals stand longer with less pain, avoid any surgery, reports they talked with him about a fusion   Currently in Pain? Yes   Pain Score 2    Pain Location Calf   Pain Orientation Right   Pain Descriptors / Indicators Aching   Pain Type Chronic pain   Pain Onset More than a month ago   Pain Frequency Intermittent   Aggravating Factors  standing   Pain Relieving Factors sitting   Effect of Pain on Daily Activities can't stand long                                PT Education - 01/23/15 1134    Education provided Yes   Education Details wms flexion exercises for DDD and then LE flexibility exercises   Person(s) Educated Patient   Methods Explanation;Demonstration;Handout   Comprehension Verbalized understanding          PT Short Term Goals - 01/23/15 1136    PT SHORT TERM GOAL #1   Title independent with initial HEP   Time 2   Period Weeks   Status New           PT Long Term Goals - 01/23/15 1137    PT LONG TERM GOAL #1   Title decrease calf pain 50%   Time 8   Period Weeks   Status New   PT LONG TERM GOAL #2   Title tolerate standing 10-15 minutes   Time 8   Period Weeks   Status New   PT LONG TERM GOAL #3   Title understand proper posture and body mechanics   Time 8   Period Weeks   Status New   PT LONG TERM GOAL #4   Title demonstrate piriformis stretch without cues  Time 8   Period Weeks   Status New               Plan - 01/23/15 1135    Clinical Impression Statement Patient with diagnosis of DDD, he is a truck driver.  He has pain mostly with standing, pain is located on the right lateral calf area.  Very tight LE's, core weakness.   Pt will benefit from skilled therapeutic intervention in order to improve on the following deficits Decreased range of motion;Pain;Impaired flexibility;Improper body mechanics;Postural dysfunction   Rehab Potential Good   PT Frequency 2x / week   PT Duration 8 weeks   PT Treatment/Interventions Electrical Stimulation;Moist Heat;Therapeutic exercise;Manual techniques;Traction;Neuromuscular re-education;Patient/family education   PT Next Visit Plan see if traction helped, will need to work on core stability and LE flexibility as well as body mechanics   Consulted and Agree with Plan of Care Patient         Problem List Patient Active Problem List   Diagnosis Date Noted  . Essential hypertension, benign 01/13/2014   . Bilateral lower extremity edema 01/13/2014  . Upper airway cough syndrome 11/16/2013    Sumner Boast., PT 01/24/2015, 1:52 PM  [address] [place], [place], [address] Phone: [phone]   Fax:  [phone]

## 2015-01-30 ENCOUNTER — Encounter: Payer: Self-pay | Admitting: Physical Therapy

## 2015-01-30 ENCOUNTER — Ambulatory Visit: Payer: 59 | Admitting: Physical Therapy

## 2015-01-30 DIAGNOSIS — M5441 Lumbago with sciatica, right side: Secondary | ICD-10-CM

## 2015-01-30 DIAGNOSIS — M629 Disorder of muscle, unspecified: Secondary | ICD-10-CM

## 2015-01-30 NOTE — Therapy (Signed)
[place] [address] [place], [place], [address] Phone: [phone]   Fax:  [phone]  Physical Therapy Treatment  Patient Details  Name: Richard Henson MRN: [id number] Date of Birth: 05-Nov-1950 No Data Recorded  Encounter Date: 01/30/2015      PT End of Session - 01/30/15 1138    Visit Number 2   Date for PT Re-Evaluation 03/25/15   PT Start Time 1105   PT Stop Time 1144   PT Time Calculation (min) 39 min   Activity Tolerance Patient tolerated treatment well   Behavior During Therapy [facility] for tasks assessed/performed      Past Medical History  Diagnosis Date  . Hypertension   . Cancer [facility])     prostate; conservative management 11/2013    Past Surgical History  Procedure Laterality Date  . Finger surgery  2003    infection in [place]  . Dental surgery  11/2013    There were no vitals filed for this visit.  Visit Diagnosis:  Hamstring tightness of both lower extremities  Right-sided low back pain with right-sided sciatica      Subjective Assessment - 01/30/15 1106    Subjective Pt reports no change since eval. Pt reports that he still hurts but its not as bad, pain when standing for extended periods of time.   Currently in Pain? No/denies   Pain Score 0-No pain   Pain Location Calf   Pain Orientation Right   Pain Descriptors / Indicators Aching   Pain Type Chronic pain                         [facility] Adult PT Treatment/Exercise - 01/30/15 0001    Exercises   Exercises Knee/Hip;Lumbar   Lumbar Exercises: Aerobic   Elliptical I10 R5 x3 min.    Lumbar Exercises: Machines for Strengthening   Leg Press #60 2x15    Lumbar Exercises: Standing   Row 15 reps;Power tower;Strengthening;Both  30 reps    Row Limitations #45    Other Standing Lumbar Exercises Standing straight arm pull downs #45 2x15    Lumbar Exercises: Seated   Other Seated Lumbar Exercises Seated row #45 x15 , #55  x15 , Cross body low to high reaches 2x10 each    Other Seated Lumbar Exercises Lat pull downs #45 2x15    Lumbar Exercises: Sidelying   Other Sidelying Lumbar Exercises Standing forward reach with blue weighted ball 2x10   Lumbar Exercises: Prone   Other Prone Lumbar Exercises Plank 15 sec, 20 sec   Knee/Hip Exercises: Aerobic   Nustep L5 x6 min    Knee/Hip Exercises: Standing   Other Standing Knee Exercises Step ups with OHP with blue weighted ball 10 reps both sides    Knee/Hip Exercises: Seated   Sit to Sand 10 reps;without UE support;2 sets  holding blue weighted ball.                   PT Short Term Goals - 01/23/15 1136    PT SHORT TERM GOAL #1   Title independent with initial HEP   Time 2   Period Weeks   Status New           PT Long Term Goals - 01/23/15 1137    PT LONG TERM GOAL #1   Title decrease calf pain 50%   Time 8   Period Weeks   Status New   PT  LONG TERM GOAL #2   Title tolerate standing 10-15 minutes   Time 8   Period Weeks   Status New   PT LONG TERM GOAL #3   Title understand proper posture and body mechanics   Time 8   Period Weeks   Status New   PT LONG TERM GOAL #4   Title demonstrate piriformis stretch without cues   Time 8   Period Weeks   Status New               Problem List Patient Active Problem List   Diagnosis Date Noted  . Essential hypertension, benign 01/13/2014  . Bilateral lower extremity edema 01/13/2014  . Upper airway cough syndrome 11/16/2013    Scot Jun, PTA 01/30/2015, 11:45 AM  [address] [place], [place], [address] Phone: [phone]   Fax:  [phone]  Name: Richard Henson MRN: [id number] Date of Birth: 09/11/1950

## 2015-02-06 ENCOUNTER — Ambulatory Visit: Payer: 59 | Admitting: Physical Therapy

## 2015-02-06 ENCOUNTER — Encounter: Payer: Self-pay | Admitting: Physical Therapy

## 2015-02-06 DIAGNOSIS — M629 Disorder of muscle, unspecified: Secondary | ICD-10-CM

## 2015-02-06 DIAGNOSIS — M5441 Lumbago with sciatica, right side: Secondary | ICD-10-CM

## 2015-02-06 NOTE — Therapy (Signed)
[address] [place], [place], [address] Phone: [phone]   Fax:  [phone]  Physical Therapy Treatment  Patient Details  Name: Richard Henson MRN: [id number] Date of Birth: 03-08-1951 No Data Recorded  Encounter Date: 02/06/2015      PT End of Session - 02/06/15 1143    Visit Number 3   Date for PT Re-Evaluation 03/25/15   PT Start Time 1106   PT Stop Time 1145   PT Time Calculation (min) 39 min   Activity Tolerance Patient tolerated treatment well   Behavior During Therapy [facility] for tasks assessed/performed      Past Medical History  Diagnosis Date  . Hypertension   . Cancer [facility])     prostate; conservative management 11/2013    Past Surgical History  Procedure Laterality Date  . Finger surgery  2003    infection in [place]  . Dental surgery  11/2013    There were no vitals filed for this visit.  Visit Diagnosis:  Hamstring tightness of both lower extremities  Right-sided low back pain with right-sided sciatica      Subjective Assessment - 02/06/15 1106    Subjective Pt reports that he is doing well, therapy has helped him a lot. Less pain overall   Patient Stated Goals stand longer with less pain, avoid any surgery, reports they talked with him about a fusion   Currently in Pain? No/denies   Pain Score 0-No pain                         [facility] Adult PT Treatment/Exercise - 02/06/15 0001    Lumbar Exercises: Aerobic   Stationary Bike NuStep L5 x4 min    Elliptical I10 R5 x3 min.    Lumbar Exercises: Machines for Strengthening   Leg Press #60 2x15    Lumbar Exercises: Standing   Row 15 reps;Power tower;Strengthening;Both  2 sets    Row Limitations 45lb   Other Standing Lumbar Exercises Standing straight arm pull downs #45 2x15    Lumbar Exercises: Seated   Other Seated Lumbar Exercises Seated row #55 2x15; seated OGP #8 2x10;  Cross body low to high reaches x10 each     Other Seated Lumbar Exercises Lat pull downs #45 2x15    Knee/Hip Exercises: Standing   Forward Step Up 2 sets;Step Height: 8";10 reps;Both  #20    Knee/Hip Exercises: Seated   Sit to Sand 15 reps;2 sets;without UE support  holding blue weighted ball OHP                  PT Short Term Goals - 02/06/15 1147    PT SHORT TERM GOAL #1   Title independent with initial HEP   Status Achieved           PT Long Term Goals - 02/06/15 1147    PT LONG TERM GOAL #1   Title decrease calf pain 50%   Status Partially Met   PT LONG TERM GOAL #2   Title tolerate standing 10-15 minutes   Status Partially Met               Plan - 02/06/15 1144    Clinical Impression Statement pt 5 minutes late or PT session. 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 Pt tolerated all gym level exercises well that consisted of core strengthening.    Pt will benefit  from skilled therapeutic intervention in order to improve on the following deficits Decreased range of motion;Pain;Impaired flexibility;Improper body mechanics;Postural dysfunction   Rehab Potential Good   PT Frequency 2x / week   PT Duration 8 weeks   PT Treatment/Interventions Electrical Stimulation;Moist Heat;Therapeutic exercise;Manual techniques;Traction;Neuromuscular re-education;Patient/family education   PT Next Visit Plan Continue core stability.        Problem List Patient Active Problem List   Diagnosis Date Noted  . Essential hypertension, benign 01/13/2014  . Bilateral lower extremity edema 01/13/2014  . Upper airway cough syndrome 11/16/2013    Scot Jun, PTA  02/06/2015, 11:48 AM  [address] [place], [place], [address] Phone: [phone]   Fax:  [phone]  Name: Richard Henson MRN: [id number] Date of Birth: [birth year]

## 2015-02-20 ENCOUNTER — Encounter: Payer: Self-pay | Admitting: Physical Therapy

## 2015-02-20 ENCOUNTER — Ambulatory Visit: Payer: 59 | Attending: Neurosurgery | Admitting: Physical Therapy

## 2015-02-20 DIAGNOSIS — M5441 Lumbago with sciatica, right side: Secondary | ICD-10-CM | POA: Insufficient documentation

## 2015-02-20 DIAGNOSIS — M629 Disorder of muscle, unspecified: Secondary | ICD-10-CM | POA: Insufficient documentation

## 2015-02-20 NOTE — Therapy (Signed)
[address] [place], [place], [address] Phone: [phone]   Fax:  [phone]  Physical Therapy Treatment  Patient Details  Name: Richard Henson MRN: [id number] Date of Birth: Dec 06, 1950 No Data Recorded  Encounter Date: 02/20/2015      PT End of Session - 02/20/15 1141    Visit Number 4   Date for PT Re-Evaluation 03/25/15   PT Start Time 1100   PT Stop Time 1142   PT Time Calculation (min) 42 min   Activity Tolerance Patient tolerated treatment well   Behavior During Therapy [facility] for tasks assessed/performed      Past Medical History  Diagnosis Date  . Hypertension   . Cancer [facility])     prostate; conservative management 11/2013    Past Surgical History  Procedure Laterality Date  . Finger surgery  2003    infection in [place]  . Dental surgery  11/2013    There were no vitals filed for this visit.  Visit Diagnosis:  Right-sided low back pain with right-sided sciatica  Hamstring tightness of both lower extremities      Subjective Assessment - 02/20/15 1101    Subjective Pt reports that he has been on vacation the past week and reports no functional issues. Pt continues to report improvement with his R leg pain. "I still have pain its not as bad as it use too"    Currently in Pain? Yes   Pain Score 1    Pain Location Leg   Pain Orientation Right                         [facility] Adult PT Treatment/Exercise - 02/20/15 0001    Ambulation/Gait   Gait Comments amb 3 flights of stairs at brisk pace    Lumbar Exercises: Aerobic   Elliptical I10 R5 41fd/3rev   Lumbar Exercises: Machines for Strengthening   Cybex Knee Extension #25 2x15    Cybex Knee Flexion #35 2x15    Leg Press #70 2x15    Lumbar Exercises: Standing   Row 15 reps;Power tower;Strengthening;Both  2 sets, rev grip    Row Limitations #45    Other Standing Lumbar Exercises Standing straight arm pull downs #45 2x15     Lumbar Exercises: Seated   Other Seated Lumbar Exercises Seated row #55 2x15; seated OHP #8 2x10;  Cross body low to high reaches x10 each    Other Seated Lumbar Exercises Lat pull downs #45 2x15    Knee/Hip Exercises: Standing   Forward Step Up 2 sets;Step Height: 8";10 reps;Both  #8   Knee/Hip Exercises: Seated   Sit to Sand without UE support;10 reps;3 sets  OHP blue weighted ball                   PT Short Term Goals - 02/06/15 1147    PT SHORT TERM GOAL #1   Title independent with initial HEP   Status Achieved           PT Long Term Goals - 02/06/15 1147    PT LONG TERM GOAL #1   Title decrease calf pain 50%   Status Partially Met   PT LONG TERM GOAL #2   Title tolerate standing 10-15 minutes   Status Partially Met               Plan - 02/20/15 1142    Clinical Impression Statement Pt tolerated  all interventions well. Today's interventions consisted of functional and isometric strengthening challenging core and endurance. Reports no pain throughout session.    Pt will benefit from skilled therapeutic intervention in order to improve on the following deficits Decreased range of motion;Pain;Impaired flexibility;Improper body mechanics;Postural dysfunction   Rehab Potential Good   PT Frequency 2x / week   PT Duration 8 weeks   PT Treatment/Interventions Electrical Stimulation;Moist Heat;Therapeutic exercise;Manual techniques;Traction;Neuromuscular re-education;Patient/family education   PT Next Visit Plan Continue core stability.        Problem List Patient Active Problem List   Diagnosis Date Noted  . Essential hypertension, benign 01/13/2014  . Bilateral lower extremity edema 01/13/2014  . Upper airway cough syndrome 11/16/2013    Richard Henson, PTA  02/20/2015, 11:43 AM  [address] [place], [place], [address] Phone: [phone]   Fax:   [phone]  Name: Richard Henson MRN: [id number] Date of Birth: 04-08-1951

## 2015-02-22 ENCOUNTER — Encounter: Payer: Self-pay | Admitting: Physical Therapy

## 2015-02-22 ENCOUNTER — Ambulatory Visit: Payer: 59 | Admitting: Physical Therapy

## 2015-02-22 DIAGNOSIS — M5441 Lumbago with sciatica, right side: Secondary | ICD-10-CM | POA: Diagnosis not present

## 2015-02-22 DIAGNOSIS — M629 Disorder of muscle, unspecified: Secondary | ICD-10-CM

## 2015-02-22 NOTE — Therapy (Signed)
[place] [address] [place], [place], [address] Phone: [phone]   Fax:  [phone]  Physical Therapy Treatment  Patient Details  Name: Richard Henson MRN: [id number] Date of Birth: 1950/05/17 No Data Recorded  Encounter Date: 02/22/2015      PT End of Session - 02/22/15 1139    Visit Number 5   Date for PT Re-Evaluation 03/25/15   PT Start Time 1106   PT Stop Time 1139   PT Time Calculation (min) 33 min   Activity Tolerance Patient tolerated treatment well   Behavior During Therapy [facility] for tasks assessed/performed      Past Medical History  Diagnosis Date  . Hypertension   . Cancer [facility])     prostate; conservative management 11/2013    Past Surgical History  Procedure Laterality Date  . Finger surgery  2003    infection in [place]  . Dental surgery  11/2013    There were no vitals filed for this visit.  Visit Diagnosis:  Hamstring tightness of both lower extremities  Right-sided low back pain with right-sided sciatica      Subjective Assessment - 02/22/15 1106    Subjective Pt reports that he ran a little Wednesday that caused a little irritation in R leg but has a big improvement.    Currently in Pain? Yes   Pain Score 1    Pain Location Leg            [facility] PT Assessment - 02/22/15 0001    AROM   Overall AROM Comments Lumbar ROM WFL                      [facility] Adult PT Treatment/Exercise - 02/22/15 0001    Ambulation/Gait   Gait Comments amb 3 flights of stairs at brisk pace, One lap around building on uneven terrain up and down slope.    Lumbar Exercises: Aerobic   Elliptical I10 R5 12fd/3rev   Lumbar Exercises: Machines for Strengthening   Cybex Knee Extension #35 2x15    Cybex Knee Flexion #35 2x15    Leg Press #80 2x15    Lumbar Exercises: Standing   Other Standing Lumbar Exercises Standing row with rev grip #45 2x15    Lumbar Exercises: Seated   Other Seated  Lumbar Exercises Seated row #55 2x15; seated OHP #8 2x10;  Cross body low to high reaches x10 each    Other Seated Lumbar Exercises Lat pull downs #55 2x15                   PT Short Term Goals - 02/06/15 1147    PT SHORT TERM GOAL #1   Title independent with initial HEP   Status Achieved           PT Long Term Goals - 02/22/15 1110    PT LONG TERM GOAL #1   Title decrease calf pain 50%   Status Achieved   PT LONG TERM GOAL #2   Title tolerate standing 10-15 minutes   Status Partially Met   PT LONG TERM GOAL #3   Title understand proper posture and body mechanics   Status Achieved   PT LONG TERM GOAL #4   Title demonstrate piriformis stretch without cues   Status Achieved               Plan - 02/22/15 1139    Clinical Impression Statement 6 minutes late for PT  treatment. Pt continues to perform well in PT. PT has met some goals and able to amb on uneven surfaces. Good strength overall. No issues with pain   Pt will benefit from skilled therapeutic intervention in order to improve on the following deficits Decreased range of motion;Pain;Impaired flexibility;Improper body mechanics;Postural dysfunction   Rehab Potential Good   PT Frequency 2x / week   PT Duration 8 weeks   PT Treatment/Interventions Electrical Stimulation;Moist Heat;Therapeutic exercise;Manual techniques;Traction;Neuromuscular re-education;Patient/family education   PT Next Visit Plan Continue core strength, D/C        Problem List Patient Active Problem List   Diagnosis Date Noted  . Essential hypertension, benign 01/13/2014  . Bilateral lower extremity edema 01/13/2014  . Upper airway cough syndrome 11/16/2013    Richard Henson, PTA  02/22/2015, 11:41 AM  [address] [place], [place], [address] Phone: [phone]   Fax:  [phone]  Name: Richard Henson MRN: [id number] Date of Birth:  May 13, 1950

## 2015-02-27 ENCOUNTER — Encounter: Payer: Self-pay | Admitting: Physical Therapy

## 2015-02-27 ENCOUNTER — Ambulatory Visit: Payer: 59 | Admitting: Physical Therapy

## 2015-02-27 DIAGNOSIS — M5441 Lumbago with sciatica, right side: Secondary | ICD-10-CM

## 2015-02-27 DIAGNOSIS — M629 Disorder of muscle, unspecified: Secondary | ICD-10-CM

## 2015-02-27 NOTE — Therapy (Signed)
[address] [place], [place], [address] Phone: [phone]   Fax:  [phone]  Physical Therapy Treatment  Patient Details  Name: Richard Henson MRN: [id number] Date of Birth: 01-04-1951 No Data Recorded  Encounter Date: 02/27/2015      PT End of Session - 02/27/15 1140    Visit Number 6   Date for PT Re-Evaluation 03/25/15   PT Start Time 1103   PT Stop Time 1140   PT Time Calculation (min) 37 min      Past Medical History  Diagnosis Date  . Hypertension   . Cancer [facility])     prostate; conservative management 11/2013    Past Surgical History  Procedure Laterality Date  . Finger surgery  2003    infection in [place]  . Dental surgery  11/2013    There were no vitals filed for this visit.  Visit Diagnosis:  Right-sided low back pain with right-sided sciatica  Hamstring tightness of both lower extremities      Subjective Assessment - 02/27/15 1104    Subjective Pt reports that he is still active at home and things are going fine.   Currently in Pain? No/denies   Pain Score 0-No pain                         [facility] Adult PT Treatment/Exercise - 02/27/15 0001    Lumbar Exercises: Aerobic   Elliptical I10 R5 35fd/3rev   Lumbar Exercises: Machines for Strengthening   Cybex Knee Extension #35 2x15    Cybex Knee Flexion #45 2x15    Leg Press #80 2x15    Lumbar Exercises: Standing   Other Standing Lumbar Exercises Standing row with rev grip #45 2x15    Other Standing Lumbar Exercises Standing AR press #45 x10    Lumbar Exercises: Seated   Other Seated Lumbar Exercises Seated row #65 2x15; seated OHP #8 2x15;  Cross body low to high reaches x10 each    Other Seated Lumbar Exercises Lat pull downs #65 2x15    Lumbar Exercises: Prone   Other Prone Lumbar Exercises Plank 15 sec, 20 sec   Knee/Hip Exercises: Seated   Sit to Sand without UE support;2 sets;15 reps  OHP with blue  weighted ball                   PT Short Term Goals - 02/06/15 1147    PT SHORT TERM GOAL #1   Title independent with initial HEP   Status Achieved           PT Long Term Goals - 02/27/15 1105    PT LONG TERM GOAL #2   Title tolerate standing 10-15 minutes   Status Achieved               Plan - 02/27/15 1140    Clinical Impression Statement Pt has perform well in PT this date. Has progressed and completed all goals.    Pt will benefit from skilled therapeutic intervention in order to improve on the following deficits Decreased range of motion;Pain;Impaired flexibility;Improper body mechanics;Postural dysfunction   Rehab Potential Good   PT Frequency 2x / week   PT Duration 8 weeks   PT Treatment/Interventions Electrical Stimulation;Moist Heat;Therapeutic exercise;Manual techniques;Traction;Neuromuscular re-education;Patient/family education   PT Next Visit Plan D/C PT         Problem List Patient Active Problem List   Diagnosis Date Noted  .  Essential hypertension, benign 01/13/2014  . Bilateral lower extremity edema 01/13/2014  . Upper airway cough syndrome 11/16/2013   PHYSICAL THERAPY DISCHARGE SUMMARY  Visits from Start of Care: 6   Plan: Patient agrees to discharge.  Patient goals were met. Patient is being discharged due to meeting the stated rehab goals.  ?????       Scot Jun, PTA  02/27/2015, 11:41 AM  [address] [place], [place], [address] Phone: [phone]   Fax:  [phone]  Name: Richard Henson MRN: [id number] Date of Birth: August 05, 1950

## 2015-03-01 ENCOUNTER — Ambulatory Visit: Payer: 59 | Admitting: Physical Therapy

## 2018-09-16 ENCOUNTER — Other Ambulatory Visit: Payer: Self-pay | Admitting: Urology

## 2018-09-16 DIAGNOSIS — C61 Malignant neoplasm of prostate: Secondary | ICD-10-CM

## 2019-01-01 ENCOUNTER — Other Ambulatory Visit: Payer: Self-pay

## 2019-01-01 ENCOUNTER — Ambulatory Visit
Admission: RE | Admit: 2019-01-01 | Discharge: 2019-01-01 | Disposition: A | Discharge status: Home / Self Care | Payer: 59 | Source: Ambulatory Visit | Attending: Urology | Admitting: Urology

## 2019-01-01 DIAGNOSIS — C61 Malignant neoplasm of prostate: Secondary | ICD-10-CM

## 2019-01-01 IMAGING — MR MR PROSTATE WO/W CM
56 series · 56 of 56 positions shown · IV contrast (multihance)
Comparison: None.

CLINICAL DATA: HGPIN at the right lateral apex on prior [[place]]
biopsy, now with PSA 2.09, evaluate for prostate cancer.

EXAM:
MR PROSTATE WITHOUT AND WITH CONTRAST
TECHNIQUE: Multiplanar multisequence MRI images were obtained of the pelvis
centered about the prostate. Pre and post contrast images were
obtained.
CONTRAST:  20mL MULTIHANCE GADOBENATE DIMEGLUMINE 529 MG/ML IV SOLN

[Series 3: T1 · axial · 8.0mm · 1.06mm/px · 1 of 28 slices shown (1 of 2)]
[im 1/28]
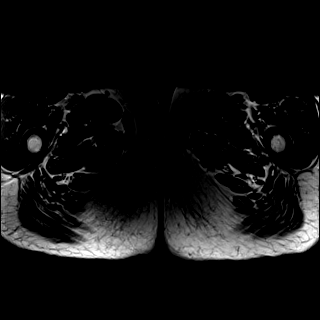

[Series 4: bSSFP fat-sat · axial · 8.0mm · 0.74mm/px · 1 of 28 slices shown]
[im 1/28]
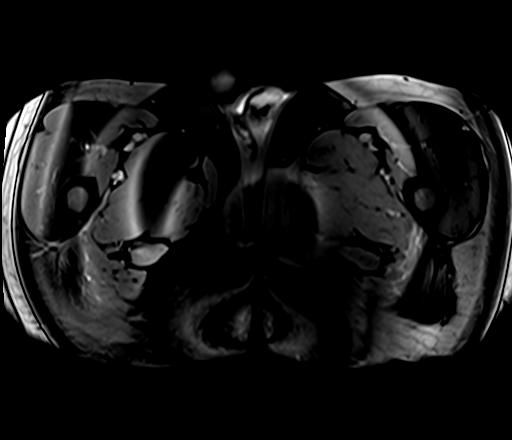

[Series 5: T2 · sagittal · 3.5mm · 0.56mm/px · 1 of 39 slices shown (1 of 4)]
[im 1/39]
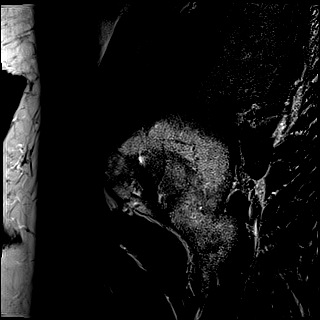

[Series 6: T1 · axial · 3.0mm · 0.31mm/px · 1 of 24 slices shown (2 of 2)]
[im 1/24]
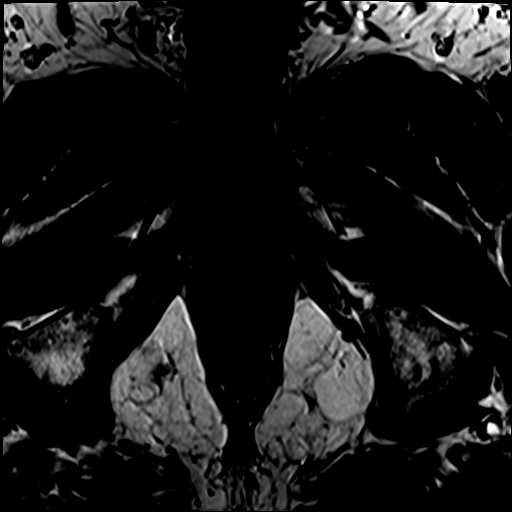

[Series 7: T2 · axial · 3.5mm · 0.56mm/px · 1 of 23 slices shown (2 of 4)]
[im 1/23]
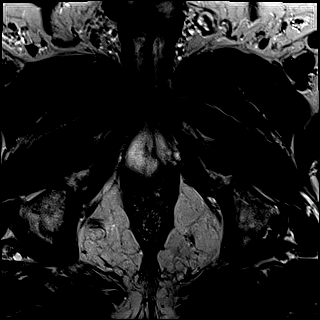

[Series 8: T2 · axial · 1.0mm · 1.04mm/px · 1 of 80 slices shown (3 of 4)]
[im 1/80]
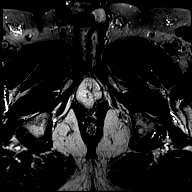

[Series 9: T2 · coronal · 3.5mm · 0.56mm/px · 1 of 23 slices shown (4 of 4)]
[im 1/23]
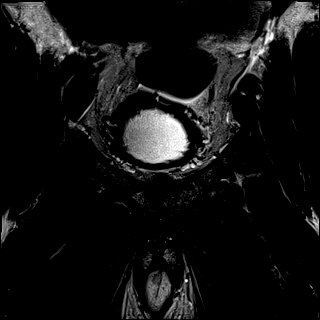

[Series 10: DWI · axial · 3.5mm · 1.56mm/px · 1 of 66 slices shown (1 of 2)]
[im 1/66]
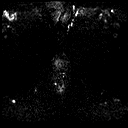

[Series 11: DWI · axial · 3.5mm · 1.56mm/px · 1 of 22 slices shown (2 of 2)]
[im 1/22]
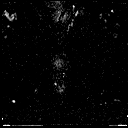

[Series 12: pre t1_twist_tra_dyn_ttc=5.8s · axial · non-contrast · 3.5mm · 0.83mm/px · 1 of 22 slices shown]
[im 1/22]
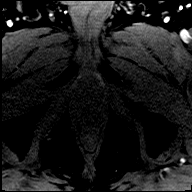

[Series 13: post t1_twist_tra_dyn-copy center · axial · 3.5mm · 0.83mm/px · 1 of 22 slices shown (1 of 24)]
[im 1/22]
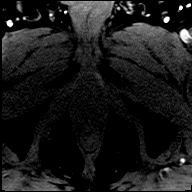

[Series 14: post t1_twist_tra_dyn-copy center · axial · 3.5mm · 0.83mm/px · 1 of 22 slices shown (2 of 24)]
[im 1/22]
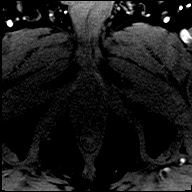

[Series 15: post t1_twist_tra_dyn-copy cent_sub_ttc=(id) · axial · 3.5mm · 0.83mm/px · 1 of 22 slices shown (1 of 22)]
[im 1/22]
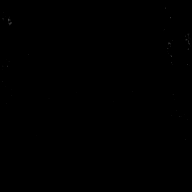

[Series 16: post t1_twist_tra_dyn-copy center · axial · 3.5mm · 0.83mm/px · 1 of 22 slices shown (3 of 24)]
[im 1/22]
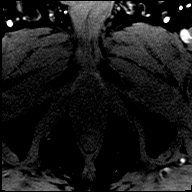

[Series 17: post t1_twist_tra_dyn-copy cent_sub_ttc=(id) · axial · 3.5mm · 0.83mm/px · 1 of 22 slices shown (2 of 22)]
[im 1/22]
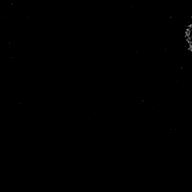

[Series 18: post t1_twist_tra_dyn-copy center · axial · 3.5mm · 0.83mm/px · 1 of 22 slices shown (4 of 24)]
[im 1/22]
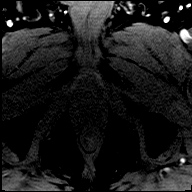

[Series 19: post t1_twist_tra_dyn-copy cent_sub_ttc=(id) · axial · 3.5mm · 0.83mm/px · 1 of 22 slices shown (3 of 22)]
[im 1/22]
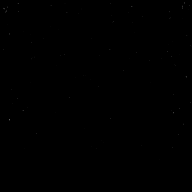

[Series 20: post t1_twist_tra_dyn-copy center · axial · 3.5mm · 0.83mm/px · 1 of 22 slices shown (5 of 24)]
[im 1/22]
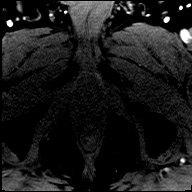

[Series 21: post t1_twist_tra_dyn-copy cent_sub_ttc=(id) · axial · 3.5mm · 0.83mm/px · 1 of 22 slices shown (4 of 22)]
[im 1/22]
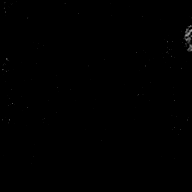

[Series 22: post t1_twist_tra_dyn-copy center · axial · 3.5mm · 0.83mm/px · 1 of 22 slices shown (6 of 24)]
[im 1/22]
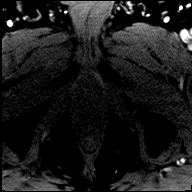

[Series 23: post t1_twist_tra_dyn-copy cent_sub_ttc=(id) · axial · 3.5mm · 0.83mm/px · 1 of 22 slices shown (5 of 22)]
[im 1/22]
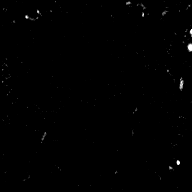

[Series 24: post t1_twist_tra_dyn-copy center · axial · 3.5mm · 0.83mm/px · 1 of 22 slices shown (7 of 24)]
[im 1/22]
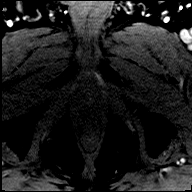

[Series 25: post t1_twist_tra_dyn-copy cent_sub_ttc=(id) · axial · 3.5mm · 0.83mm/px · 1 of 22 slices shown (6 of 22)]
[im 1/22]
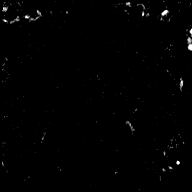

[Series 26: post t1_twist_tra_dyn-copy center · axial · 3.5mm · 0.83mm/px · 1 of 22 slices shown (8 of 24)]
[im 1/22]
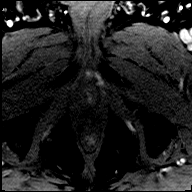

[Series 27: post t1_twist_tra_dyn-copy cent_sub_ttc=(id) · axial · 3.5mm · 0.83mm/px · 1 of 22 slices shown (7 of 22)]
[im 1/22]
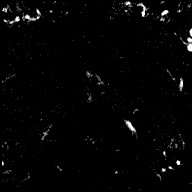

[Series 28: post t1_twist_tra_dyn-copy center · axial · 3.5mm · 0.83mm/px · 1 of 22 slices shown (9 of 24)]
[im 1/22]
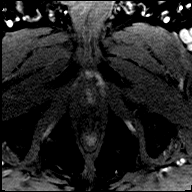

[Series 29: post t1_twist_tra_dyn-copy cent_sub_ttc=(id) · axial · 3.5mm · 0.83mm/px · 1 of 22 slices shown (8 of 22)]
[im 1/22]
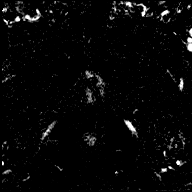

[Series 30: post t1_twist_tra_dyn-copy center · axial · 3.5mm · 0.83mm/px · 1 of 22 slices shown (10 of 24)]
[im 1/22]
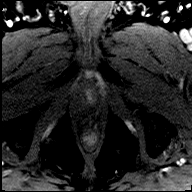

[Series 31: post t1_twist_tra_dyn-copy cent_sub_ttc=(id) · axial · 3.5mm · 0.83mm/px · 1 of 22 slices shown (9 of 22)]
[im 1/22]
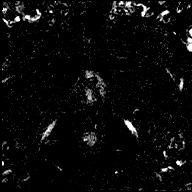

[Series 32: post t1_twist_tra_dyn-copy center · axial · 3.5mm · 0.83mm/px · 1 of 22 slices shown (11 of 24)]
[im 1/22]
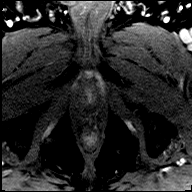

[Series 33: post t1_twist_tra_dyn-copy cent_sub_ttc=(id) · axial · 3.5mm · 0.83mm/px · 1 of 22 slices shown (10 of 22)]
[im 1/22]
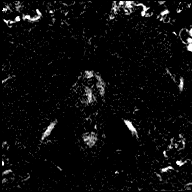

[Series 34: post t1_twist_tra_dyn-copy center · axial · 3.5mm · 0.83mm/px · 1 of 22 slices shown (12 of 24)]
[im 1/22]
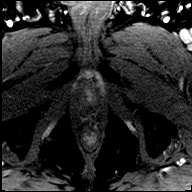

[Series 35: post t1_twist_tra_dyn-copy cent_sub_ttc=(id) · axial · 3.5mm · 0.83mm/px · 1 of 22 slices shown (11 of 22)]
[im 1/22]
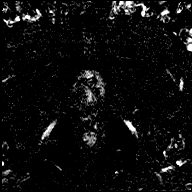

[Series 36: post t1_twist_tra_dyn-copy center · axial · 3.5mm · 0.83mm/px · 1 of 22 slices shown (13 of 24)]
[im 1/22]
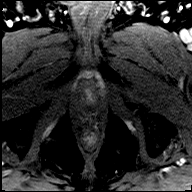

[Series 37: post t1_twist_tra_dyn-copy cent_sub_ttc=(id) · axial · 3.5mm · 0.83mm/px · 1 of 22 slices shown (12 of 22)]
[im 1/22]
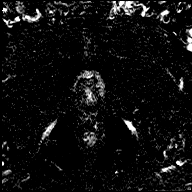

[Series 38: post t1_twist_tra_dyn-copy center · axial · 3.5mm · 0.83mm/px · 1 of 22 slices shown (14 of 24)]
[im 1/22]
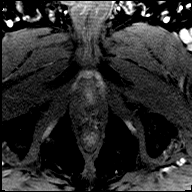

[Series 39: post t1_twist_tra_dyn-copy cent_sub_ttc=(id) · axial · 3.5mm · 0.83mm/px · 1 of 22 slices shown (13 of 22)]
[im 1/22]
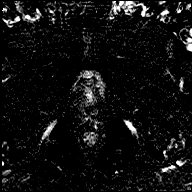

[Series 40: post t1_twist_tra_dyn-copy center · axial · 3.5mm · 0.83mm/px · 1 of 22 slices shown (15 of 24)]
[im 1/22]
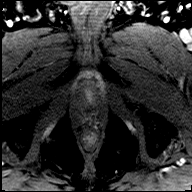

[Series 41: post t1_twist_tra_dyn-copy cent_sub_ttc=(id) · axial · 3.5mm · 0.83mm/px · 1 of 22 slices shown (14 of 22)]
[im 1/22]
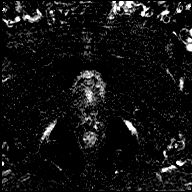

[Series 42: post t1_twist_tra_dyn-copy center · axial · 3.5mm · 0.83mm/px · 1 of 22 slices shown (16 of 24)]
[im 1/22]
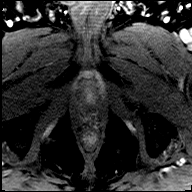

[Series 43: post t1_twist_tra_dyn-copy cent_sub_ttc=(id) · axial · 3.5mm · 0.83mm/px · 1 of 22 slices shown (15 of 22)]
[im 1/22]
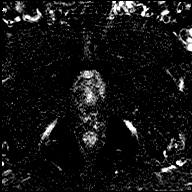

[Series 44: post t1_twist_tra_dyn-copy center · axial · 3.5mm · 0.83mm/px · 1 of 22 slices shown (17 of 24)]
[im 1/22]
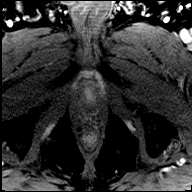

[Series 45: post t1_twist_tra_dyn-copy cent_sub_ttc=(id) · axial · 3.5mm · 0.83mm/px · 1 of 22 slices shown (16 of 22)]
[im 1/22]
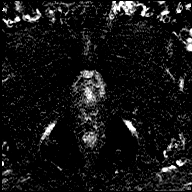

[Series 46: post t1_twist_tra_dyn-copy center · axial · 3.5mm · 0.83mm/px · 1 of 22 slices shown (18 of 24)]
[im 1/22]
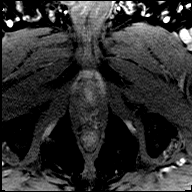

[Series 47: post t1_twist_tra_dyn-copy cent_sub_ttc=(id) · axial · 3.5mm · 0.83mm/px · 1 of 22 slices shown (17 of 22)]
[im 1/22]
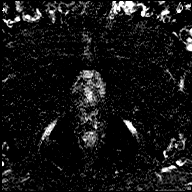

[Series 48: post t1_twist_tra_dyn-copy center · axial · 3.5mm · 0.83mm/px · 1 of 22 slices shown (19 of 24)]
[im 1/22]
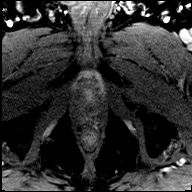

[Series 49: post t1_twist_tra_dyn-copy cent_sub_ttc=(id) · axial · 3.5mm · 0.83mm/px · 1 of 22 slices shown (18 of 22)]
[im 1/22]
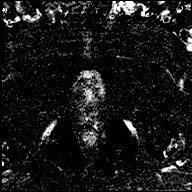

[Series 50: post t1_twist_tra_dyn-copy center · axial · 3.5mm · 0.83mm/px · 1 of 22 slices shown (20 of 24)]
[im 1/22]
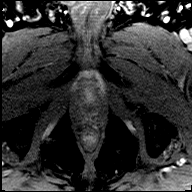

[Series 51: post t1_twist_tra_dyn-copy cent_sub_ttc=(id) · axial · 3.5mm · 0.83mm/px · 1 of 22 slices shown (19 of 22)]
[im 1/22]
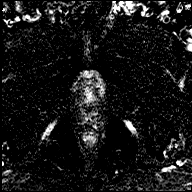

[Series 52: post t1_twist_tra_dyn-copy center · axial · 3.5mm · 0.83mm/px · 1 of 22 slices shown (21 of 24)]
[im 1/22]
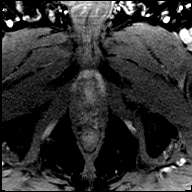

[Series 53: post t1_twist_tra_dyn-copy cent_sub_ttc=(id) · axial · 3.5mm · 0.83mm/px · 1 of 22 slices shown (20 of 22)]
[im 1/22]
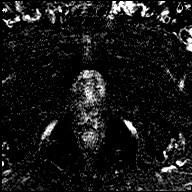

[Series 54: post t1_twist_tra_dyn-copy center · axial · 3.5mm · 0.83mm/px · 1 of 22 slices shown (22 of 24)]
[im 1/22]
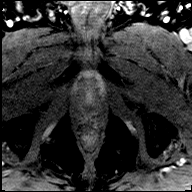

[Series 55: post t1_twist_tra_dyn-copy cent_sub_ttc=(id) · axial · 3.5mm · 0.83mm/px · 1 of 22 slices shown (21 of 22)]
[im 1/22]
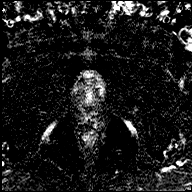

[Series 56: post t1_twist_tra_dyn-copy center · axial · 3.5mm · 0.83mm/px · 1 of 22 slices shown (23 of 24)]
[im 1/22]
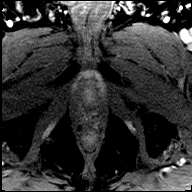

[Series 57: post t1_twist_tra_dyn-copy cent_sub_ttc=(id) · axial · 3.5mm · 0.83mm/px · 1 of 22 slices shown (22 of 22)]
[im 1/22]
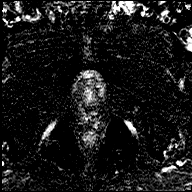

[Series 58: post t1_twist_tra_dyn-copy center · axial · 3.5mm · 0.83mm/px · 1 of 22 slices shown (24 of 24)]
[im 1/22]
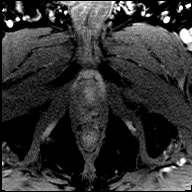

[56 of 56 positions shown; findings below may reference images not displayed]

FINDINGS: Prostate: Focal low T2 lesion in the lateral right mid gland (series
7/image 12), corresponds to area of intrinsic T1 hyperintensity on
precontrast T1 imaging (series 12/image 11), likely reflecting
hemorrhage related to prior biopsy.

Otherwise, mildly heterogeneous T2 signal throughout the peripheral
zone without focal T2 lesion. Mild diffuse early arterial
enhancement at the apex of the peripheral gland bilaterally (series
23/image 17), relatively symmetric without underlying lesion. No
restricted diffusion/early arterial enhancement. Overall, there are
no findings specific for high-grade macroscopic prostate cancer
within the peripheral gland.

Enlargement/nodularity of the central gland, suggesting BPH. Several
low T2 nodules are present, but these generally demonstrate well
demarcated borders, without findings considered suspicious for
central gland tumor.

Volume: 6.3 x 4.9 x 6.6 cm (calculated volume 85.8 mL).

Transcapsular spread:  Absent.

Seminal vesicle involvement: Absent.

Neurovascular bundle involvement: Absent.

Pelvic adenopathy: Absent.

Bone metastasis: Absent.

Other findings: Mildly thick-walled bladder, although
underdistended, likely reflecting chronic bladder outlet
obstruction.
IMPRESSION: No findings specific for high-grade macroscopic prostate cancer on
MRI.

Enlargement/nodularity of the central gland, suggesting BPH.

Calculated prostate volume 86 mL.

## 2019-01-01 MED ORDER — GADOBENATE DIMEGLUMINE 529 MG/ML IV SOLN
20.0000 mL | Freq: Once | INTRAVENOUS | Status: AC | PRN
  Administered 2019-01-01: 20 mL via INTRAVENOUS

## 2019-01-19 ENCOUNTER — Other Ambulatory Visit: Payer: 59

## 2021-03-15 ENCOUNTER — Other Ambulatory Visit: Payer: Self-pay | Admitting: Urology

## 2021-03-15 ENCOUNTER — Other Ambulatory Visit (HOSPITAL_COMMUNITY): Payer: Self-pay | Admitting: Urology

## 2021-03-15 DIAGNOSIS — C61 Malignant neoplasm of prostate: Secondary | ICD-10-CM

## 2021-03-27 ENCOUNTER — Ambulatory Visit (HOSPITAL_COMMUNITY)
Admission: RE | Admit: 2021-03-27 | Discharge: 2021-03-27 | Disposition: A | Discharge status: Home / Self Care | Payer: Medicare Other | Source: Ambulatory Visit | Attending: Urology | Admitting: Urology

## 2021-03-27 ENCOUNTER — Other Ambulatory Visit: Payer: Self-pay

## 2021-03-27 DIAGNOSIS — C61 Malignant neoplasm of prostate: Secondary | ICD-10-CM | POA: Insufficient documentation

## 2021-03-27 IMAGING — MR MR PROSTATE WO/W CM
13 series · 48 of 48 positions shown · IV contrast (gadavist)
Comparison: Comparison is made with [DATE].

CLINICAL DATA: A 70-year-old male presents for evaluation of
high-grade prostate intraepithelial neoplasm, follow-up evaluation.
Elevated PSA as well.

EXAM:
MR PROSTATE WITHOUT AND WITH CONTRAST
TECHNIQUE: Multiplanar multisequence MRI images were obtained of the pelvis
centered about the prostate. Pre and post contrast images were
obtained.
CONTRAST:  10mL GADAVIST GADOBUTROL 1 MMOL/ML IV SOLN

[Series 3: T1 · axial · 5.0mm · 1.19mm/px · z∈[-123,+232]mm · 2 of 72 slices shown (1 of 2)]
[im 1/72]
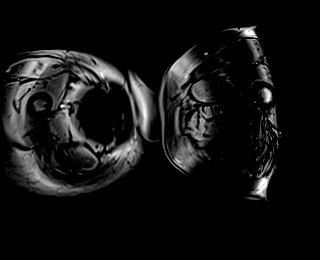
[im 72/72]
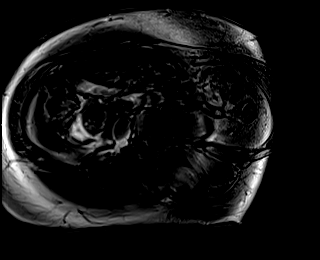

[Series 4: T1 · axial · 5.0mm · 1.19mm/px · z∈[-123,+232]mm · 2 of 72 slices shown (2 of 2)]
[im 1/72]
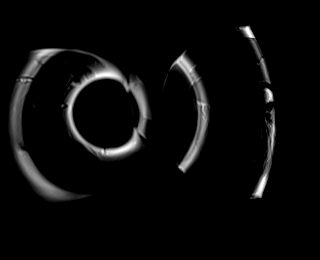
[im 72/72]
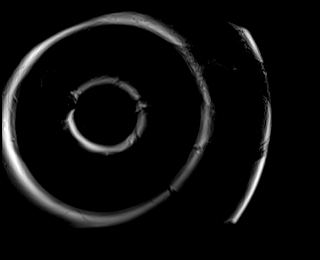

[Series 5: T2 · axial · 3.0mm · 0.47mm/px · 1 of 30 slices shown (1 of 4)]
[im 1/30]
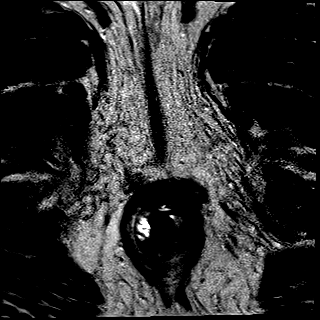

[Series 6: T2 · coronal · 3.0mm · 0.47mm/px · 1 of 26 slices shown (2 of 4)]
[im 1/26]
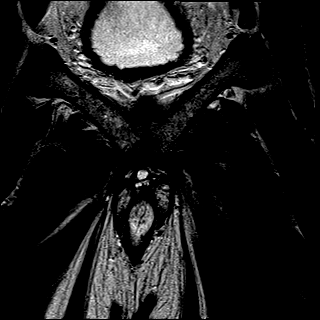

[Series 7: T2 · axial · 1.0mm · 1.00mm/px · z∈[-10,+69]mm · 2 of 80 slices shown (3 of 4)]
[im 1/80]
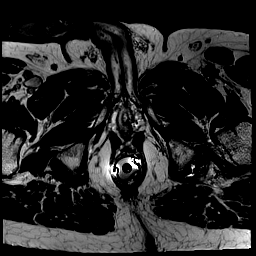
[im 80/80]
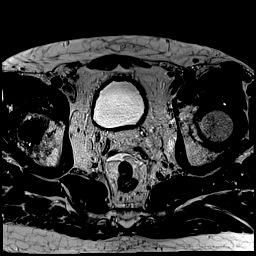

[Series 8: T2 · axial · 3.0mm · 0.47mm/px · 1 of 30 slices shown (4 of 4)]
[im 1/30]
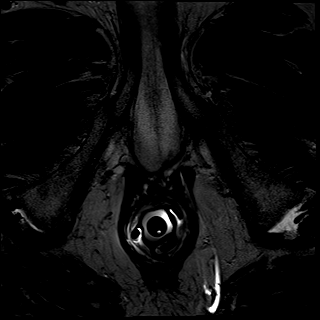

[Series 9: ep2d_diff_b100_500_800_tra_endo**_tracew_dfc_mix · axial · 3.0mm · 1.60mm/px · z∈[-19,+68]mm · 2 of 88 slices shown]
[im 1/88]
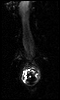
[im 88/88]
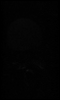

[Series 10: ep2d_diff_b100_500_800_tra_endo**_adc_dfc_mix · axial · 3.0mm · 1.60mm/px · 1 of 30 slices shown]
[im 1/30]
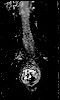

[Series 11: ep2d_diff_b100_500_800_tra_endo**_calc_bval_dfc_mix · axial · 3.0mm · 1.60mm/px · 1 of 30 slices shown]
[im 1/30]
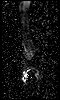

[Series 12: ep2d_diff_bvalue (id) · axial · 3.0mm · 1.60mm/px · 1 of 25 slices shown]
[im 1/25]
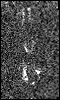

[Series 13: axial multiphase · axial · 3.0mm · 0.98mm/px · z∈[-24,+62]mm · 16 of 600 slices shown]
[im 1/600]
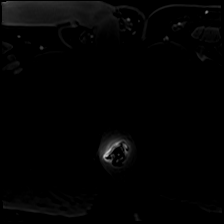
[im 40/600]
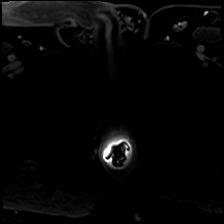
[im 80/600]
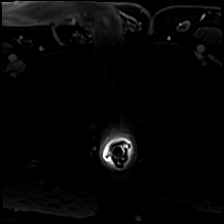
[im 120/600]
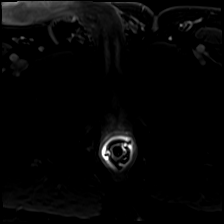
[im 160/600]
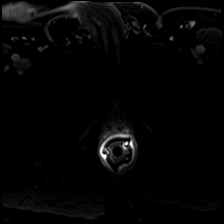
[im 200/600]
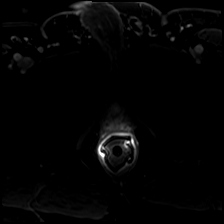
[im 240/600]
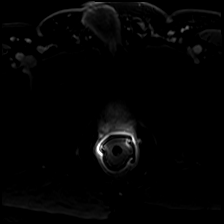
[im 280/600]
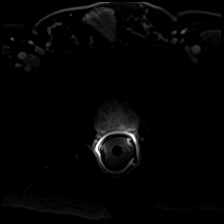
[im 320/600]
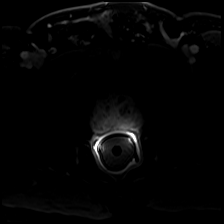
[im 360/600]
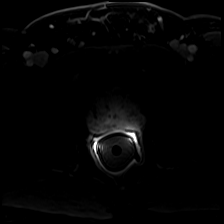
[im 400/600]
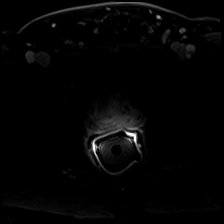
[im 440/600]
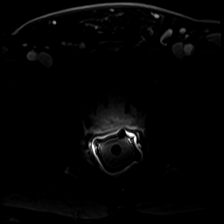
[im 480/600]
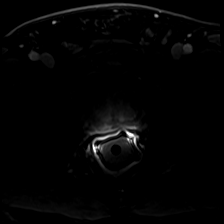
[im 520/600]
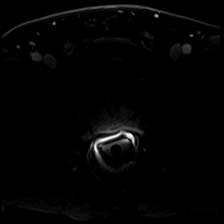
[im 560/600]
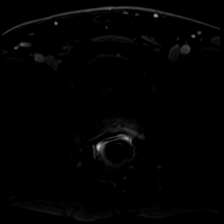
[im 600/600]
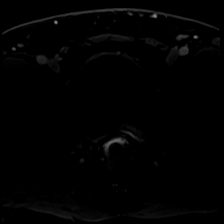

[Series 14: axial multiphase_sub · axial · 3.0mm · 0.98mm/px · z∈[-24,+62]mm · 15 of 570 slices shown]
[im 1/570]
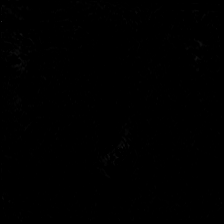
[im 41/570]
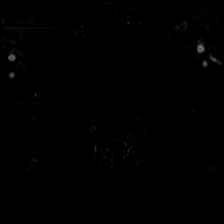
[im 82/570]
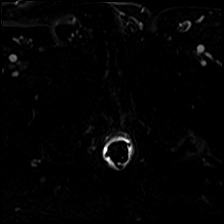
[im 122/570]
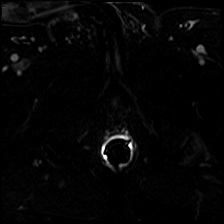
[im 163/570]
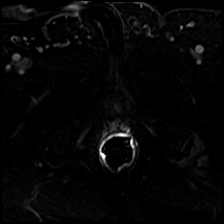
[im 204/570]
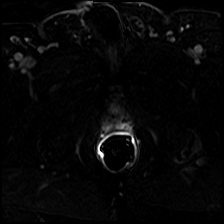
[im 244/570]
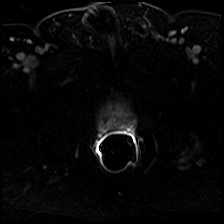
[im 285/570]
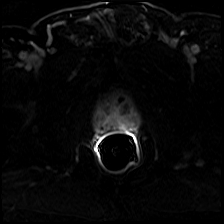
[im 326/570]
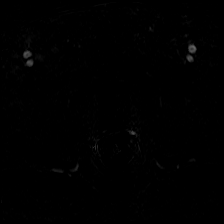
[im 366/570]
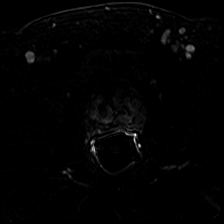
[im 407/570]
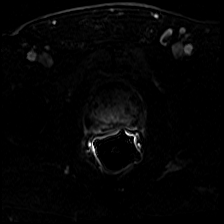
[im 448/570]
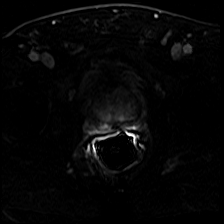
[im 488/570]
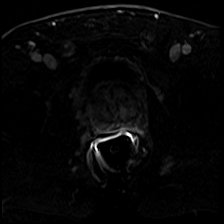
[im 529/570]
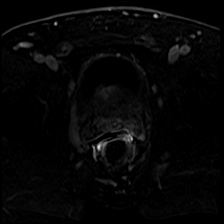
[im 570/570]
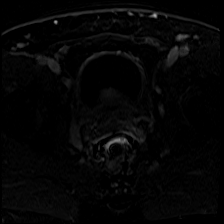

[Series 16: iliac crest thru · axial · 2.5mm · 1.19mm/px · z∈[-72,+246]mm · 3 of 128 slices shown]
[im 1/128]
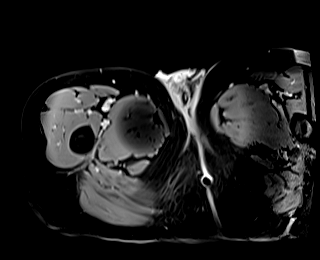
[im 64/128]
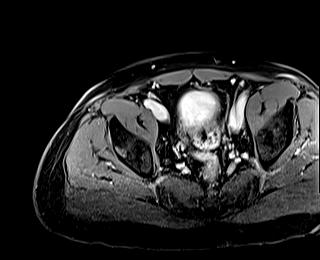
[im 128/128]
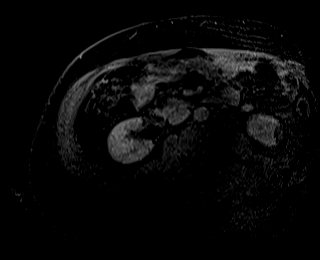

[48 of 48 positions shown; findings below may reference images not displayed]

FINDINGS: Prostate:

Transitional zone: Numerous BPH nodules with similar pattern to
prior imaging. No high-risk lesion.

Peripheral zone: Thinning of the peripheral zone with heterogeneous
appearance no high-risk lesion. Signs of extruded BPH nodules at the
RIGHT base contributing to mildly heterogeneous enhancement in this
area.

Scattered areas of enhancement following T2 abnormalities in the
peripheral zone favored to represent sequela of prior prostatitis.

Volume: 6.4 x 4.5 x 5.9 (volume = 89 cc) cm.

Transcapsular spread:  Absent

Seminal vesicle involvement: Absent

Neurovascular bundle involvement: Absent

Pelvic adenopathy: Absent

Bone metastasis: Absent

Other findings: None
IMPRESSION: No signs of high-risk lesion in the prostate. Overall assessment
PIRADS category 2 with extensive changes of BPH and areas of
extruded BPH nodules as well as sequela of prior prostatitis.

## 2021-03-27 MED ORDER — GADOBUTROL 1 MMOL/ML IV SOLN
10.0000 mL | Freq: Once | INTRAVENOUS | Status: AC | PRN
  Administered 2021-03-27: 13:00:00 10 mL via INTRAVENOUS

## 2023-12-29 ENCOUNTER — Other Ambulatory Visit: Payer: Self-pay | Admitting: Urology

## 2023-12-29 DIAGNOSIS — C61 Malignant neoplasm of prostate: Secondary | ICD-10-CM

## 2024-02-01 ENCOUNTER — Ambulatory Visit
Admission: RE | Admit: 2024-02-01 | Discharge: 2024-02-01 | Disposition: A | Source: Ambulatory Visit | Attending: Urology | Admitting: Urology

## 2024-02-01 DIAGNOSIS — C61 Malignant neoplasm of prostate: Secondary | ICD-10-CM

## 2024-02-01 MED ORDER — GADOPICLENOL 0.5 MMOL/ML IV SOLN
10.0000 mL | Freq: Once | INTRAVENOUS | Status: AC | PRN
  Administered 2024-02-01: 10 mL via INTRAVENOUS
